# Patient Record
Sex: Female | Born: 1975 | Race: White | Hispanic: No | Smoking: Never smoker
Health system: Southern US, Community
[De-identification: ages and names within clinical notes are randomized; demographics above are authoritative.]

## PROBLEM LIST (undated history)

## (undated) DIAGNOSIS — Z8719 Personal history of other diseases of the digestive system: Secondary | ICD-10-CM

## (undated) DIAGNOSIS — L981 Factitial dermatitis: Secondary | ICD-10-CM

## (undated) DIAGNOSIS — I509 Heart failure, unspecified: Secondary | ICD-10-CM

## (undated) DIAGNOSIS — F410 Panic disorder [episodic paroxysmal anxiety] without agoraphobia: Secondary | ICD-10-CM

## (undated) DIAGNOSIS — K56609 Unspecified intestinal obstruction, unspecified as to partial versus complete obstruction: Secondary | ICD-10-CM

## (undated) DIAGNOSIS — M5481 Occipital neuralgia: Secondary | ICD-10-CM

## (undated) DIAGNOSIS — G43909 Migraine, unspecified, not intractable, without status migrainosus: Secondary | ICD-10-CM

## (undated) DIAGNOSIS — Q211 Atrial septal defect, unspecified: Secondary | ICD-10-CM

## (undated) DIAGNOSIS — F424 Excoriation (skin-picking) disorder: Secondary | ICD-10-CM

## (undated) HISTORY — DX: Heart failure, unspecified: I50.9

## (undated) HISTORY — PX: ABDOMINAL SURGERY: SHX537

## (undated) HISTORY — DX: Panic disorder (episodic paroxysmal anxiety): F41.0

## (undated) HISTORY — PX: OTHER SURGICAL HISTORY: SHX169

## (undated) HISTORY — PX: CHOLECYSTECTOMY: SHX55

---

## 2004-09-10 ENCOUNTER — Ambulatory Visit: Payer: Self-pay | Admitting: Otolaryngology

## 2004-10-02 ENCOUNTER — Emergency Department: Payer: Self-pay | Admitting: Emergency Medicine

## 2004-10-12 ENCOUNTER — Emergency Department: Payer: Self-pay | Admitting: Emergency Medicine

## 2004-11-15 ENCOUNTER — Emergency Department: Payer: Self-pay | Admitting: Emergency Medicine

## 2004-12-16 ENCOUNTER — Emergency Department: Payer: Self-pay | Admitting: Emergency Medicine

## 2005-01-12 ENCOUNTER — Encounter: Payer: Self-pay | Admitting: Physician Assistant

## 2005-01-14 ENCOUNTER — Encounter: Payer: Self-pay | Admitting: Physician Assistant

## 2005-02-13 ENCOUNTER — Encounter: Payer: Self-pay | Admitting: Physician Assistant

## 2005-03-01 ENCOUNTER — Ambulatory Visit: Payer: Self-pay | Admitting: Physician Assistant

## 2005-03-16 ENCOUNTER — Encounter: Payer: Self-pay | Admitting: Physician Assistant

## 2005-03-18 ENCOUNTER — Emergency Department: Payer: Self-pay | Admitting: Emergency Medicine

## 2005-07-21 ENCOUNTER — Ambulatory Visit: Payer: Self-pay | Admitting: Physician Assistant

## 2005-08-05 ENCOUNTER — Encounter: Payer: Self-pay | Admitting: Unknown Physician Specialty

## 2005-08-16 ENCOUNTER — Encounter: Payer: Self-pay | Admitting: Unknown Physician Specialty

## 2006-01-19 ENCOUNTER — Ambulatory Visit: Payer: Self-pay | Admitting: Pain Medicine

## 2006-01-29 ENCOUNTER — Ambulatory Visit: Payer: Self-pay | Admitting: Pain Medicine

## 2006-03-02 ENCOUNTER — Ambulatory Visit: Payer: Self-pay | Admitting: Pain Medicine

## 2006-03-31 ENCOUNTER — Ambulatory Visit: Payer: Self-pay | Admitting: Pain Medicine

## 2006-05-09 ENCOUNTER — Ambulatory Visit: Payer: Self-pay | Admitting: Physician Assistant

## 2006-06-08 ENCOUNTER — Ambulatory Visit: Payer: Self-pay | Admitting: Physician Assistant

## 2006-06-14 ENCOUNTER — Ambulatory Visit: Payer: Self-pay | Admitting: Pain Medicine

## 2006-07-18 ENCOUNTER — Ambulatory Visit: Payer: Self-pay | Admitting: Physician Assistant

## 2006-07-28 ENCOUNTER — Ambulatory Visit: Payer: Self-pay | Admitting: Pain Medicine

## 2006-07-29 ENCOUNTER — Ambulatory Visit: Payer: Self-pay | Admitting: Physician Assistant

## 2006-08-17 ENCOUNTER — Ambulatory Visit: Payer: Self-pay | Admitting: Physician Assistant

## 2006-09-07 ENCOUNTER — Ambulatory Visit: Payer: Self-pay | Admitting: Unknown Physician Specialty

## 2006-09-14 ENCOUNTER — Ambulatory Visit: Payer: Self-pay | Admitting: Unknown Physician Specialty

## 2006-09-26 ENCOUNTER — Ambulatory Visit: Payer: Self-pay | Admitting: Physician Assistant

## 2007-05-18 ENCOUNTER — Ambulatory Visit: Payer: Self-pay | Admitting: Psychiatry

## 2007-09-11 ENCOUNTER — Inpatient Hospital Stay: Payer: Self-pay | Admitting: Psychiatry

## 2007-09-11 ENCOUNTER — Other Ambulatory Visit: Payer: Self-pay

## 2008-07-13 ENCOUNTER — Emergency Department: Payer: Self-pay | Admitting: Emergency Medicine

## 2008-08-02 ENCOUNTER — Emergency Department: Payer: Self-pay | Admitting: Emergency Medicine

## 2008-08-16 ENCOUNTER — Emergency Department: Payer: Self-pay | Admitting: Unknown Physician Specialty

## 2008-08-22 ENCOUNTER — Emergency Department: Payer: Self-pay | Admitting: Emergency Medicine

## 2008-10-03 ENCOUNTER — Emergency Department: Payer: Self-pay | Admitting: Emergency Medicine

## 2008-10-22 ENCOUNTER — Ambulatory Visit: Payer: Self-pay | Admitting: Unknown Physician Specialty

## 2008-10-31 ENCOUNTER — Ambulatory Visit: Payer: Self-pay | Admitting: Unknown Physician Specialty

## 2008-11-05 ENCOUNTER — Emergency Department: Payer: Self-pay | Admitting: Emergency Medicine

## 2008-11-08 ENCOUNTER — Ambulatory Visit: Payer: Self-pay | Admitting: Unknown Physician Specialty

## 2010-06-06 ENCOUNTER — Ambulatory Visit: Payer: Self-pay | Admitting: Internal Medicine

## 2010-09-10 ENCOUNTER — Ambulatory Visit: Payer: Self-pay | Admitting: Internal Medicine

## 2011-04-08 ENCOUNTER — Ambulatory Visit: Payer: Self-pay | Admitting: Family Medicine

## 2011-11-22 ENCOUNTER — Ambulatory Visit: Payer: Self-pay | Admitting: Unknown Physician Specialty

## 2012-01-13 ENCOUNTER — Ambulatory Visit: Payer: Self-pay | Admitting: Orthopedic Surgery

## 2012-02-20 ENCOUNTER — Ambulatory Visit: Payer: Self-pay | Admitting: Medical

## 2012-09-28 ENCOUNTER — Emergency Department: Payer: Self-pay | Admitting: Emergency Medicine

## 2012-09-28 LAB — CBC
HCT: 43.6 % (ref 35.0–47.0)
HGB: 14.8 g/dL (ref 12.0–16.0)
MCH: 29.3 pg (ref 26.0–34.0)
MCV: 86 fL (ref 80–100)
RDW: 13.1 % (ref 11.5–14.5)
WBC: 11.6 10*3/uL — ABNORMAL HIGH (ref 3.6–11.0)

## 2012-09-28 LAB — COMPREHENSIVE METABOLIC PANEL
Bilirubin,Total: 0.9 mg/dL (ref 0.2–1.0)
Calcium, Total: 8.3 mg/dL — ABNORMAL LOW (ref 8.5–10.1)
Co2: 20 mmol/L — ABNORMAL LOW (ref 21–32)
Creatinine: 0.57 mg/dL — ABNORMAL LOW (ref 0.60–1.30)
EGFR (Non-African Amer.): >60
Osmolality: 275 (ref 275–301)
Potassium: 3.7 mmol/L (ref 3.5–5.1)
SGOT(AST): 22 U/L (ref 15–37)
SGPT (ALT): 27 U/L (ref 12–78)
Total Protein: 7.5 g/dL (ref 6.4–8.2)

## 2012-09-28 LAB — URINALYSIS, COMPLETE
Bilirubin,UR: NEGATIVE
Blood: NEGATIVE
Glucose,UR: NEGATIVE mg/dL (ref 0–75)
Ketone: NEGATIVE
Leukocyte Esterase: NEGATIVE
Ph: 6 (ref 4.5–8.0)
Protein: 25
Squamous Epithelial: 3
WBC UR: 1 /HPF (ref 0–5)

## 2012-09-28 LAB — LIPASE, BLOOD: Lipase: 89 U/L (ref 73–393)

## 2013-01-29 ENCOUNTER — Ambulatory Visit: Payer: Self-pay | Admitting: Emergency Medicine

## 2013-01-29 LAB — URINALYSIS, COMPLETE
Glucose,UR: NEGATIVE mg/dL (ref 0–75)
Nitrite: NEGATIVE

## 2013-01-29 LAB — PREGNANCY, URINE: Pregnancy Test, Urine: NEGATIVE m[IU]/mL

## 2013-05-09 ENCOUNTER — Other Ambulatory Visit: Payer: Self-pay | Admitting: Sports Medicine

## 2013-05-09 DIAGNOSIS — M545 Low back pain: Secondary | ICD-10-CM

## 2014-10-12 ENCOUNTER — Emergency Department: Payer: Self-pay | Admitting: Emergency Medicine

## 2014-11-04 ENCOUNTER — Ambulatory Visit: Payer: Self-pay | Admitting: Endocrinology

## 2015-03-27 ENCOUNTER — Inpatient Hospital Stay
Admission: EM | Admit: 2015-03-27 | Discharge: 2015-04-04 | DRG: 438 | Disposition: A | Discharge status: Home / Self Care | Payer: 59 | Attending: Internal Medicine | Admitting: Internal Medicine

## 2015-03-27 ENCOUNTER — Encounter: Payer: Self-pay | Admitting: Urgent Care

## 2015-03-27 DIAGNOSIS — R000 Tachycardia, unspecified: Secondary | ICD-10-CM | POA: Diagnosis present

## 2015-03-27 DIAGNOSIS — I10 Essential (primary) hypertension: Secondary | ICD-10-CM | POA: Diagnosis present

## 2015-03-27 DIAGNOSIS — R1084 Generalized abdominal pain: Secondary | ICD-10-CM

## 2015-03-27 DIAGNOSIS — K859 Acute pancreatitis without necrosis or infection, unspecified: Secondary | ICD-10-CM

## 2015-03-27 DIAGNOSIS — Z791 Long term (current) use of non-steroidal anti-inflammatories (NSAID): Secondary | ICD-10-CM

## 2015-03-27 DIAGNOSIS — R0602 Shortness of breath: Secondary | ICD-10-CM

## 2015-03-27 DIAGNOSIS — K851 Biliary acute pancreatitis: Secondary | ICD-10-CM | POA: Diagnosis not present

## 2015-03-27 DIAGNOSIS — R0989 Other specified symptoms and signs involving the circulatory and respiratory systems: Secondary | ICD-10-CM

## 2015-03-27 DIAGNOSIS — Z87738 Personal history of other specified (corrected) congenital malformations of digestive system: Secondary | ICD-10-CM

## 2015-03-27 DIAGNOSIS — R112 Nausea with vomiting, unspecified: Secondary | ICD-10-CM

## 2015-03-27 DIAGNOSIS — E876 Hypokalemia: Secondary | ICD-10-CM | POA: Diagnosis present

## 2015-03-27 DIAGNOSIS — R109 Unspecified abdominal pain: Secondary | ICD-10-CM

## 2015-03-27 DIAGNOSIS — I509 Heart failure, unspecified: Secondary | ICD-10-CM | POA: Diagnosis present

## 2015-03-27 DIAGNOSIS — E8770 Fluid overload, unspecified: Secondary | ICD-10-CM | POA: Diagnosis present

## 2015-03-27 DIAGNOSIS — J9600 Acute respiratory failure, unspecified whether with hypoxia or hypercapnia: Secondary | ICD-10-CM | POA: Diagnosis present

## 2015-03-27 DIAGNOSIS — J189 Pneumonia, unspecified organism: Secondary | ICD-10-CM | POA: Diagnosis present

## 2015-03-27 DIAGNOSIS — Z9049 Acquired absence of other specified parts of digestive tract: Secondary | ICD-10-CM | POA: Diagnosis present

## 2015-03-27 HISTORY — DX: Personal history of other diseases of the digestive system: Z87.19

## 2015-03-27 HISTORY — DX: Atrial septal defect, unspecified: Q21.10

## 2015-03-27 HISTORY — DX: Atrial septal defect: Q21.1

## 2015-03-27 MED ORDER — ONDANSETRON HCL 4 MG/2ML IJ SOLN
INTRAMUSCULAR | Status: AC
  Administered 2015-03-27: 4 mg via INTRAVENOUS
  Filled 2015-03-27: qty 2

## 2015-03-27 MED ORDER — MORPHINE SULFATE 4 MG/ML IJ SOLN
INTRAMUSCULAR | Status: AC
  Administered 2015-03-27: 4 mg via INTRAVENOUS
  Filled 2015-03-27: qty 1

## 2015-03-27 NOTE — ED Notes (Signed)
Patient presents to ED with c/o epigastric pain that is through and through to her back that started today at noon. (+) N/V. Patient tearful and vomiting upon arrival.

## 2015-03-28 ENCOUNTER — Emergency Department: Payer: 59

## 2015-03-28 ENCOUNTER — Encounter: Payer: Self-pay | Admitting: Radiology

## 2015-03-28 DIAGNOSIS — E8770 Fluid overload, unspecified: Secondary | ICD-10-CM | POA: Diagnosis present

## 2015-03-28 DIAGNOSIS — Z9049 Acquired absence of other specified parts of digestive tract: Secondary | ICD-10-CM | POA: Diagnosis present

## 2015-03-28 DIAGNOSIS — Z87738 Personal history of other specified (corrected) congenital malformations of digestive system: Secondary | ICD-10-CM | POA: Diagnosis not present

## 2015-03-28 DIAGNOSIS — R000 Tachycardia, unspecified: Secondary | ICD-10-CM | POA: Diagnosis present

## 2015-03-28 DIAGNOSIS — E876 Hypokalemia: Secondary | ICD-10-CM | POA: Diagnosis present

## 2015-03-28 DIAGNOSIS — J9600 Acute respiratory failure, unspecified whether with hypoxia or hypercapnia: Secondary | ICD-10-CM | POA: Diagnosis present

## 2015-03-28 DIAGNOSIS — Z791 Long term (current) use of non-steroidal anti-inflammatories (NSAID): Secondary | ICD-10-CM | POA: Diagnosis not present

## 2015-03-28 DIAGNOSIS — R1084 Generalized abdominal pain: Secondary | ICD-10-CM | POA: Diagnosis present

## 2015-03-28 DIAGNOSIS — I509 Heart failure, unspecified: Secondary | ICD-10-CM | POA: Diagnosis present

## 2015-03-28 DIAGNOSIS — K851 Biliary acute pancreatitis: Secondary | ICD-10-CM | POA: Diagnosis present

## 2015-03-28 DIAGNOSIS — J189 Pneumonia, unspecified organism: Secondary | ICD-10-CM | POA: Diagnosis present

## 2015-03-28 DIAGNOSIS — K859 Acute pancreatitis without necrosis or infection, unspecified: Secondary | ICD-10-CM | POA: Diagnosis present

## 2015-03-28 DIAGNOSIS — I10 Essential (primary) hypertension: Secondary | ICD-10-CM | POA: Diagnosis present

## 2015-03-28 LAB — URINALYSIS COMPLETE WITH MICROSCOPIC (ARMC ONLY)
BILIRUBIN URINE: NEGATIVE
Bacteria, UA: NONE SEEN
Glucose, UA: NEGATIVE mg/dL
Hgb urine dipstick: NEGATIVE
Ketones, ur: NEGATIVE mg/dL
LEUKOCYTES UA: NEGATIVE
Nitrite: NEGATIVE
PROTEIN: NEGATIVE mg/dL
Specific Gravity, Urine: 1.032 — ABNORMAL HIGH (ref 1.005–1.030)
pH: 6 (ref 5.0–8.0)

## 2015-03-28 LAB — COMPREHENSIVE METABOLIC PANEL
ALBUMIN: 3.2 g/dL — AB (ref 3.5–5.0)
ALBUMIN: 3.7 g/dL (ref 3.5–5.0)
ALBUMIN: 4 g/dL (ref 3.5–5.0)
ALK PHOS: 64 U/L (ref 38–126)
ALT: 17 U/L (ref 14–54)
ALT: 21 U/L (ref 14–54)
ALT: 22 U/L (ref 14–54)
AST: 23 U/L (ref 15–41)
AST: 24 U/L (ref 15–41)
AST: 34 U/L (ref 15–41)
Alkaline Phosphatase: 75 U/L (ref 38–126)
Alkaline Phosphatase: 75 U/L (ref 38–126)
Anion gap: 11 (ref 5–15)
Anion gap: 8 (ref 5–15)
Anion gap: 8 (ref 5–15)
BILIRUBIN TOTAL: 0.7 mg/dL (ref 0.3–1.2)
BUN: <5 mg/dL — ABNORMAL LOW (ref 6–20)
BUN: <5 mg/dL — ABNORMAL LOW (ref 6–20)
CALCIUM: 10.5 mg/dL — AB (ref 8.9–10.3)
CALCIUM: 8.5 mg/dL — AB (ref 8.9–10.3)
CALCIUM: 9.4 mg/dL (ref 8.9–10.3)
CO2: 27 mmol/L (ref 22–32)
CO2: 27 mmol/L (ref 22–32)
CO2: 29 mmol/L (ref 22–32)
CREATININE: 0.47 mg/dL (ref 0.44–1.00)
Chloride: 99 mmol/L — ABNORMAL LOW (ref 101–111)
Chloride: 101 mmol/L (ref 101–111)
Chloride: 101 mmol/L (ref 101–111)
Creatinine, Ser: 0.63 mg/dL (ref 0.44–1.00)
Creatinine, Ser: 0.51 mg/dL (ref 0.44–1.00)
GFR calc Af Amer: >60 mL/min (ref >60)
GFR calc Af Amer: >60 mL/min (ref >60)
GFR calc non Af Amer: >60 mL/min (ref >60)
GFR calc non Af Amer: >60 mL/min (ref >60)
GLUCOSE: 143 mg/dL — AB (ref 65–99)
Glucose, Bld: 175 mg/dL — ABNORMAL HIGH (ref 65–99)
Glucose, Bld: 150 mg/dL — ABNORMAL HIGH (ref 65–99)
POTASSIUM: 3.9 mmol/L (ref 3.5–5.1)
Potassium: 4 mmol/L (ref 3.5–5.1)
Potassium: 4.2 mmol/L (ref 3.5–5.1)
SODIUM: 137 mmol/L (ref 135–145)
SODIUM: 138 mmol/L (ref 135–145)
Sodium: 136 mmol/L (ref 135–145)
TOTAL PROTEIN: 6 g/dL — AB (ref 6.5–8.1)
Total Bilirubin: 0.8 mg/dL (ref 0.3–1.2)
Total Bilirubin: 0.6 mg/dL (ref 0.3–1.2)
Total Protein: 6.8 g/dL (ref 6.5–8.1)
Total Protein: 6.7 g/dL (ref 6.5–8.1)

## 2015-03-28 LAB — LIPID PANEL
CHOL/HDL RATIO: 5.3 ratio
Cholesterol: 164 mg/dL (ref 0–200)
HDL: 31 mg/dL — ABNORMAL LOW (ref >40)
LDL CALC: 82 mg/dL (ref 0–99)
Triglycerides: 257 mg/dL — ABNORMAL HIGH (ref <150)
VLDL: 51 mg/dL — ABNORMAL HIGH (ref 0–40)

## 2015-03-28 LAB — CBC
HEMATOCRIT: 44.1 % (ref 35.0–47.0)
Hemoglobin: 15 g/dL (ref 12.0–16.0)
MCH: 29.1 pg (ref 26.0–34.0)
MCHC: 33.9 g/dL (ref 32.0–36.0)
MCV: 85.9 fL (ref 80.0–100.0)
PLATELETS: 382 10*3/uL (ref 150–440)
RBC: 5.13 MIL/uL (ref 3.80–5.20)
RDW: 13.1 % (ref 11.5–14.5)
WBC: 18.6 10*3/uL — AB (ref 3.6–11.0)

## 2015-03-28 LAB — TROPONIN I

## 2015-03-28 LAB — LIPASE, BLOOD: LIPASE: 1021 U/L — AB (ref 22–51)

## 2015-03-28 MED ORDER — HYDROMORPHONE HCL 1 MG/ML IJ SOLN
1.0000 mg | Freq: Once | INTRAMUSCULAR | Status: AC
  Administered 2015-03-28: 1 mg via INTRAVENOUS
  Filled 2015-03-28: qty 1

## 2015-03-28 MED ORDER — ONDANSETRON HCL 4 MG/2ML IJ SOLN
4.0000 mg | Freq: Once | INTRAMUSCULAR | Status: AC
  Administered 2015-03-27: 4 mg via INTRAVENOUS

## 2015-03-28 MED ORDER — SODIUM CHLORIDE 0.9 % IV BOLUS (SEPSIS)
1000.0000 mL | Freq: Once | INTRAVENOUS | Status: AC
  Administered 2015-03-28: 1000 mL via INTRAVENOUS

## 2015-03-28 MED ORDER — IOHEXOL 350 MG/ML SOLN
100.0000 mL | Freq: Once | INTRAVENOUS | Status: AC | PRN
  Administered 2015-03-28: 100 mL via INTRAVENOUS

## 2015-03-28 MED ORDER — HYDROMORPHONE HCL 1 MG/ML IJ SOLN
1.0000 mg | INTRAMUSCULAR | Status: DC | PRN
  Administered 2015-03-28 – 2015-03-31 (×15): 1 mg via INTRAVENOUS
  Filled 2015-03-28 (×15): qty 1

## 2015-03-28 MED ORDER — SODIUM CHLORIDE 0.9 % IV SOLN
INTRAVENOUS | Status: DC
  Administered 2015-03-28 – 2015-03-29 (×8): via INTRAVENOUS

## 2015-03-28 MED ORDER — MORPHINE SULFATE 4 MG/ML IJ SOLN
4.0000 mg | Freq: Once | INTRAMUSCULAR | Status: DC
  Filled 2015-03-28: qty 1

## 2015-03-28 MED ORDER — HEPARIN SODIUM (PORCINE) 5000 UNIT/ML IJ SOLN
5000.0000 [IU] | Freq: Three times a day (TID) | INTRAMUSCULAR | Status: DC
  Administered 2015-03-28 – 2015-04-04 (×22): 5000 [IU] via SUBCUTANEOUS
  Filled 2015-03-28 (×22): qty 1

## 2015-03-28 MED ORDER — ONDANSETRON HCL 4 MG/2ML IJ SOLN
INTRAMUSCULAR | Status: AC
  Administered 2015-03-28: 4 mg via INTRAVENOUS
  Filled 2015-03-28: qty 2

## 2015-03-28 MED ORDER — ONDANSETRON HCL 4 MG PO TABS: 4.0000 mg | ORAL_TABLET | Freq: Four times a day (QID) | ORAL | Status: DC | PRN

## 2015-03-28 MED ORDER — OXYCODONE HCL 5 MG PO TABS
5.0000 mg | ORAL_TABLET | ORAL | Status: DC | PRN
  Administered 2015-03-28 – 2015-04-04 (×23): 5 mg via ORAL
  Filled 2015-03-28 (×24): qty 1

## 2015-03-28 MED ORDER — ACETAMINOPHEN 325 MG PO TABS
650.0000 mg | ORAL_TABLET | Freq: Four times a day (QID) | ORAL | Status: DC | PRN
  Administered 2015-03-28: 650 mg via ORAL
  Filled 2015-03-28: qty 2

## 2015-03-28 MED ORDER — HYDROMORPHONE HCL 1 MG/ML IJ SOLN
1.0000 mg | INTRAMUSCULAR | Status: DC | PRN
  Administered 2015-03-28 (×2): 1 mg via INTRAVENOUS
  Filled 2015-03-28 (×2): qty 1

## 2015-03-28 MED ORDER — MORPHINE SULFATE 4 MG/ML IJ SOLN
4.0000 mg | Freq: Once | INTRAMUSCULAR | Status: AC
  Administered 2015-03-27: 4 mg via INTRAVENOUS

## 2015-03-28 MED ORDER — ONDANSETRON HCL 4 MG/2ML IJ SOLN
4.0000 mg | Freq: Once | INTRAMUSCULAR | Status: AC
  Administered 2015-03-28: 4 mg via INTRAVENOUS

## 2015-03-28 MED ORDER — ONDANSETRON HCL 4 MG/2ML IJ SOLN
4.0000 mg | Freq: Four times a day (QID) | INTRAMUSCULAR | Status: DC | PRN
  Administered 2015-03-28 (×3): 4 mg via INTRAVENOUS
  Filled 2015-03-28 (×3): qty 2

## 2015-03-28 MED ORDER — ACETAMINOPHEN 650 MG RE SUPP: 650.0000 mg | Freq: Four times a day (QID) | RECTAL | Status: DC | PRN

## 2015-03-28 NOTE — Progress Notes (Signed)
Patient was admitted from ED d/t to c/o of abdominal pain. Patient was able to ambulate from the ER stretcher to her room. Patient arrived with 2L of supplemental oxygen, she was A&O X4 and hemodynamically stable. Patient admission documentation was completed and was  oriented to the unit and her room. PRN pain med was administered for pain of 7/10. Patient later stated a mild relieve from pain. Patient denied nausea at this time. Patient is kept NPO overnight .Will continue to monitor.

## 2015-03-28 NOTE — Consult Note (Signed)
Consultation  Referring Provider: Dr. Delfino Lovett Primary Care Physician:  No PCP Per Patient Consulting  Gastroenterologist:    Dr. Lynnae Prude     Reason for Consultation: Acute Pancreatitis           HPI:   Helen Burgess is a 39 y.o. 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 This pain started yesterday at 1300 without apparent cause. She had  3 episodes of non- bloody, non bilious vomiting.  She had seen black stool for about 3-4 days prior-no diarrhea or foul odor. No history of ulcers or ETOH use. No gallstone problems and she reports an uncomplicated cholecystectomy many years ago and did not have bile duct problems.   She has chronic neck, head,and shoulder pain and takes 4 Ibuprofen 3- 4 times daily for about 1 year. She denies GI upset before yesterday. She also placed herself on oral potasium OTC and magnesium recently. No antibiotics. She never has heartburn and has not taken any PPI or H2 blockers. She reports a remote EGD and does not recall the results. No hx of PUD.   The abdominal pain has been constant and IV dilaudid eases-but does not resolve. She is NPO. Anorexic.  Labs and CT suggest pancreatitis. Lipase 1021. WBC 18. Hgb 15. BUN<5 Cr 0. 47.  Past Medical History  Diagnosis Date  . ASD (atrial septal defect)   . Hx SBO     Past Surgical History  Procedure Laterality Date  . Cholecystectomy    . 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 Diabetes Neg Hx      Social History  Substance Use Topics  . Smoking status: Never Smoker   . Smokeless tobacco: None  . Alcohol  Use: No    Prior to Admission medications   Not on File    Current Facility-Administered Medications  Medication Dose Route Frequency Provider Last Rate Last Dose  . 0.9 %  sodium chloride infusion   Intravenous Continuous Wyatt Haste, MD 250 mL/hr at 03/28/15 0935    . acetaminophen (TYLENOL) tablet 650 mg  650 mg Oral Q6H PRN Wyatt Haste, MD       Or  . acetaminophen (TYLENOL) suppository 650 mg  650 mg Rectal Q6H PRN Wyatt Haste, MD      . heparin injection 5,000 Units  5,000 Units Subcutaneous 3 times per day Wyatt Haste, MD   5,000 Units at 03/28/15 1235  . HYDROmorphone (DILAUDID) injection 1 mg  1 mg Intravenous Q2H PRN Delfino Lovett, MD   1 mg at 03/28/15 1443  . morphine 4 MG/ML injection 4 mg  4 mg Intravenous Once Arnaldo Natal, MD      . ondansetron [facility]) tablet 4 mg  4 mg Oral Q6H PRN Wyatt Haste, MD       Or  . ondansetron [facility]) injection 4 mg  4 mg Intravenous Q6H PRN Wyatt Haste, MD   4 mg at 03/28/15 1235  . oxyCODONE (Oxy IR/ROXICODONE) immediate release tablet 5 mg  5 mg Oral  Q4H PRN Wyatt Haste, MD        Allergies as of 03/27/2015 - Review Complete 03/27/2015  Allergen Reaction Noted  . Ultram [tramadol] Itching 03/27/2015     Review of Systems:    A 12 system review was obtained and all negative except where noted in HPI.    Physical Exam:  Vital signs in last 24 hours: Temp:  [97.9 F (36.6 C)-98.9 F (37.2 C)] 98.9 F (37.2 C) (08/12 1520) Pulse Rate:  [97-108] 106 (08/12 1520) Resp:  [17-30] 18 (08/12 1520) BP: (133-157)/(86-110) 133/87 mmHg (08/12 1520) SpO2:  [90 %-100 %] 90 % (08/12 1520) Weight:  [77.111 kg (170 lb)] 77.111 kg (170 lb) (08/11 2351) Last BM Date: 03/27/15  General:  Well-developed, well-nourished and in moderate pain- receiving pain meds Head:  Head without obvious abnormality, atraumatic  Eyes:   Conjunctiva pink, sclera anicteric   ENT:   Mouth free of lesions, mucosa moist,  tongue pink, no thrush  noted, teeth and gums normal Neck:   Supple w/o thyromegaly or mass, trachea midline, no adenopathy  Lungs: Clear to auscultation bilaterally, respirations unlabored Heart:     Normal S1S2, no rubs, murmurs, gallops. Abdomen: Soft, tender epigastric area, mildly bloated, not tense,  no hepatosplenomegaly, hernia, or mass and BS normal Rectal: Deferred Lymph:  No cervical or supraclavicular adenopathy. Extremities:   No edema, cyanosis, or clubbing Skin  Skin color, texture, turgor normal, positive acne lesions on face Neuro:  A&O x 3. CNII-XII intact, normal strength Psych:  Anxious, in pain Data Reviewed:  LAB RESULTS:  Recent Labs  03/28/15 0005  WBC 18.6*  HGB 15.0  HCT 44.1  PLT 382   BMET  Recent Labs  03/28/15 0402 03/28/15 1247  NA 138 136  K 4.0 4.2  CL 101 101  CO2 29 27  GLUCOSE 150* 143*  BUN <5* <5*  CREATININE 0.51 0.47  CALCIUM 9.4 8.5*   LFT  Recent Labs  03/28/15 1247  PROT 6.0*  ALBUMIN 3.2*  AST 23  ALT 17  ALKPHOS 64  BILITOT 0.7   PT/INR No results for input(s): LABPROT, INR in the last 72 hours.  STUDIES: Ct Abdomen Pelvis W Contrast  03/28/2015   CLINICAL DATA:  Acute onset of epigastric abdominal pain, radiating to the back. Nausea and vomiting. Initial encounter.  EXAM: CT ABDOMEN AND PELVIS WITH CONTRAST  TECHNIQUE: Multidetector CT imaging of the abdomen and pelvis was performed using the standard protocol following bolus administration of intravenous contrast.  CONTRAST:  OMNIPAQUE IOHEXOL 350 MG/ML SOLN  COMPARISON:  CT of the abdomen and pelvis from 09/28/2012  FINDINGS: Mild bibasilar atelectasis is noted.  There appears to be a small bowel interposition replacement of the esophagus, retrosternal in nature. The anastomosis with the stomach is unremarkable in appearance.  Diffuse soft tissue edema and inflammation are noted about the entirety of the pancreas, most prominent about the pancreatic head and body, with soft tissue  edema and fluid tracking inferiorly anterior to the right kidney. There is no evidence of devascularization or pseudocyst formation at this time.  The liver and spleen are unremarkable in appearance. The patient is status post cholecystectomy, with clips noted at the gallbladder fossa. The pancreas and adrenal glands are unremarkable.  The kidneys are unremarkable in appearance. There is no evidence of hydronephrosis. No renal or ureteral stones are seen. No perinephric stranding is appreciated.  No free fluid is identified. The small bowel is unremarkable in  appearance. The stomach is within normal limits. No acute vascular abnormalities are seen.  The appendix is normal in caliber, without evidence of appendicitis. The colon is unremarkable in appearance.  The bladder is mildly distended and grossly unremarkable. The uterus is unremarkable in appearance. The ovaries are grossly symmetric. No suspicious adnexal masses are seen. No inguinal lymphadenopathy is seen.  No acute osseous abnormalities are identified.  IMPRESSION: 1. Diffuse soft tissue edema and inflammation about the entirety of the pancreas, most prominent about the pancreatic head and body, with soft tissue edema and fluid tracking inferiorly anterior to the right kidney, compatible with acute pancreatitis. No evidence of devascularization or pseudocyst formation at this time. 2. Small bowel interposition replacement of the esophagus, retrosternal in nature. This is grossly unremarkable in appearance. No evidence for obstruction.   Electronically Signed   By: Roanna Raider M.D.   On: 03/28/2015 01:23     Assessment:  Staci W Noy is a 39 y.o. presents with acute upper sharp pain with radiation into back and down the abdomen a/w brief n/v and self reports melena  3- 4 days prior.  She has Ct documented pancreatitis without pseudocyst. She has elevated lipase, mild leukocytosis, normal renal function.    Etiology to rule/out mechanical cause  for pancreatitis from duodenal  stricture or obstruction,  gallstone, malignancy. Heavy chronic NSAID use at risk for PUD. However, no free air on CT. Penetrating  Peptic Ulcer is considered in differential. No ETOH past or present per patient. No family present to confirm. Lipids not too high. No new medications. Remote cholecystectomy. No hx of recent infection, trauma, vascular problems.   Plan:  Close lab monitoring. Hydration. I and O measurement. Hemoccult cards. Abd [place] to check for gallstones and bile duct. Consider Igg4, ANA . Consider eventual EGD. Further GI recommendations pending.   This case was discussed with Dr. Scot Jun in collaboration of care. Thank you for the consultation.  These services provided by Amedeo Kinsman RN, MSN, ANP-BC under collaborative practice agreement with Scot Jun, MD.   03/28/2015, 3:27 PM

## 2015-03-28 NOTE — Consult Note (Signed)
See note by Fransico Setters.  Pt with acute pancreatitis of unknown origin.  She has had previous gall bladder removal so CBD stone is a possibility.  Pancreas divisum also a possibility.  She has been on high dose NSAID and ulcer with contained perforation is a possibility.  She appears to have rather poor veins in her arms and may need a PIC line placed soon.  Recommend MRCP for tomorrow.

## 2015-03-28 NOTE — ED Provider Notes (Signed)
[facility] Emergency Department Provider Note  ____________________________________________  Time seen: Approximately 12:10 AM  I have reviewed the triage vital signs and the nursing notes.   HISTORY  Chief Complaint Abdominal Pain    HPI Helen Burgess is a 39 y.o. female who presents to the ED from home with complaints of diffuse abdominal pain and vomiting. Patient has a complicated surgical history including esophageal repair as an infant, open cholecystectomy, and history of multiple small bowel obstructionsrequiring surgical repair. Patient reports she was in her usual state of health until approximately 1 PM when she began to experience upper abdominal pain which subsequently became diffuse. Describes 10/10 sharp, constant pain which radiates to her back. Associated with 3 episodes of nausea and vomiting. Patient reports small bowel movement this morning prior to onset of pain. Nothing makes the pain better or worse. Patient states this feels similar to her prior SBOs.   Past Medical History  Diagnosis Date  . ASD (atrial septal defect)   . Hx SBO     There are no active problems to display for this patient.   Past Surgical History  Procedure Laterality Date  . Cholecystectomy    . 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  Allergies Ultram  No family history on file.  Social History Social History  Substance Use Topics  . Smoking status: Never Smoker   . Smokeless tobacco: None  . Alcohol Use: No    Review of Systems Constitutional: No fever/chills Eyes: No visual changes. ENT: No sore throat. Cardiovascular: Denies chest pain. Respiratory: Denies shortness of breath. Gastrointestinal: Nausea for abdominal pain.  Positive for nausea and vomiting.  No diarrhea.  No constipation. Genitourinary: Negative for dysuria. Musculoskeletal:  Negative for back pain. Skin: Negative for rash. Neurological: Negative for headaches, focal weakness or numbness.  10-point ROS otherwise negative.  ____________________________________________   PHYSICAL EXAM:  VITAL SIGNS: ED Triage Vitals  Enc Vitals Group     BP 03/27/15 2351 157/108 mmHg     Pulse Rate 03/27/15 2351 102     Resp 03/27/15 2351 30     Temp 03/27/15 2351 97.9 F (36.6 C)     Temp Source 03/27/15 2351 Oral     SpO2 03/27/15 2351 96 %     Weight 03/27/15 2351 170 lb (77.111 kg)     Height 03/27/15 2351 5\' 7"  (1.702 m)     Head Cir --      Peak Flow --      Pain Score 03/27/15 2352 10     Pain Loc --      Pain Edu? --      Excl. in GC? --     Constitutional: Alert and oriented. Ill-appearing and in moderate acute distress. Writhing in pain, cannot lay still. Eyes: Conjunctivae are normal. PERRL. EOMI. Head: Atraumatic. Nose: No congestion/rhinnorhea. Mouth/Throat: Mucous membranes are moist.  Oropharynx non-erythematous. Neck: No stridor.   Cardiovascular: Normal rate, regular rhythm. Grossly normal heart sounds.  Good peripheral circulation. Respiratory: Normal respiratory effort.  No retractions. Lungs CTAB. Gastrointestinal: Mildly distended, tender to palpation diffusely with guarding. No abdominal bruits. No CVA tenderness. Musculoskeletal: No lower extremity tenderness nor edema.  No joint effusions. Neurologic:  Normal speech and language. No gross focal neurologic deficits are appreciated. No gait instability. Skin:  Skin is warm, dry and intact. No rash  noted. Psychiatric: Mood and affect are normal. Speech and behavior are normal.  ____________________________________________   LABS (all labs ordered are listed, but only abnormal results are displayed)  Labs Reviewed  LIPASE, BLOOD - Abnormal; Notable for the following:    Lipase 1021 (*)    All other components within normal limits  COMPREHENSIVE METABOLIC PANEL - Abnormal; Notable for  the following:    Chloride 99 (*)    Glucose, Bld 175 (*)    BUN <5 (*)    Calcium 10.5 (*)    All other components within normal limits  CBC - Abnormal; Notable for the following:    WBC 18.6 (*)    All other components within normal limits  TROPONIN I  URINALYSIS COMPLETEWITH MICROSCOPIC ([facility] ONLY)   ____________________________________________  EKG  ED ECG REPORT I, Kaijah Abts J, the attending physician, personally viewed and interpreted this ECG.   Date: 03/28/2015  EKG Time: 2356  Rate: 97  Rhythm: normal EKG, normal sinus rhythm  Axis: Normal  Intervals:none  ST&T Change: Inverted T waves V2-V5  ____________________________________________  RADIOLOGY  CT abdomen and pelvis with contrast (viewed by me, interpreted per Dr. Cherly Hensen): 1. Diffuse soft tissue edema and inflammation about the entirety of the pancreas, most prominent about the pancreatic head and body, with soft tissue edema and fluid tracking inferiorly anterior to the right kidney, compatible with acute pancreatitis. No evidence of devascularization or pseudocyst formation at this time. 2. Small bowel interposition replacement of the esophagus, retrosternal in nature. This is grossly unremarkable in appearance. No evidence for obstruction.  ____________________________________________   PROCEDURES  Procedure(s) performed: None  Critical Care performed: Yes, see critical care note(s)  CRITICAL CARE Performed by: Irean Hong   Total critical care time: 30 minutes  Critical care time was exclusive of separately billable procedures and treating other patients.  Critical care was necessary to treat or prevent imminent or life-threatening deterioration.  Critical care was time spent personally by me on the following activities: development of treatment plan with patient and/or surrogate as well as nursing, discussions with consultants, evaluation of patient's response to treatment, examination of  patient, obtaining history from patient or surrogate, ordering and performing treatments and interventions, ordering and review of laboratory studies, ordering and review of radiographic studies, pulse oximetry and re-evaluation of patient's condition.  ____________________________________________   INITIAL IMPRESSION / ASSESSMENT AND PLAN / ED COURSE  Pertinent labs & imaging results that were available during my care of the patient were reviewed by me and considered in my medical decision making (see chart for details).  39 year old female with a history of multiple abdominal surgeries presenting with abdominal pain and vomiting; symptoms concerning for small bowel obstruction. IV morphine did nothing for patient's pain; she continues to writhe in the bed. Will administer IV dilaudid and proceed directly for noncontrast CT scan of her abdomen/pelvis.  ----------------------------------------- 1:36 AM on 03/28/2015 -----------------------------------------  Patient is resting more comfortably. Glycerin swabs given for dry mouth. Updated patient of lab and CT results. Discussed with hospitalist for evaluation in the emergency department for hospital admission.  ____________________________________________   FINAL CLINICAL IMPRESSION(S) / ED DIAGNOSES  Final diagnoses:  Generalized abdominal pain  Non-intractable vomiting with nausea, vomiting of unspecified type  Acute pancreatitis, unspecified pancreatitis type      Irean Hong, MD 03/28/15 [phone]

## 2015-03-28 NOTE — Progress Notes (Addendum)
Initial Nutrition Assessment   INTERVENTION:   Coordination of care: await diet advancement as able   NUTRITION DIAGNOSIS:   Inadequate oral intake related to inability to eat as evidenced by NPO status.  GOAL:   Patient will meet greater than or equal to 90% of their needs  MONITOR:    (Energy Intake, Gastrointestinal Profile, Anthropometrics)  REASON FOR ASSESSMENT:   Malnutrition Screening Tool, Diagnosis    ASSESSMENT:   Pt admitted with acute pancreatitis.  Pt very sleepy/groggy on visit this afternoon.  Past Medical History  Diagnosis Date  . ASD (atrial septal defect)   . Hx SBO     Diet Order:  Diet NPO time specified Except for: Sips with Meds    Current Nutrition: Pt currently NPO   Food/Nutrition-Related History: Pt report eating less the past 4-6 weeks PTA, eating breakfast in the am and then a little something before going to bed daily.    Medications: NS at 260mL/hr, morphine, dilaudid  Electrolyte/Renal Profile and Glucose Profile:   Recent Labs Lab 03/28/15 0005 03/28/15 0402 03/28/15 1247  NA 137 138 136  K 3.9 4.0 4.2  CL 99* 101 101  CO2 BUN <5* <5* <5*  CREATININE 0.63 0.51 0.47  CALCIUM 10.5* 9.4 8.5*  GLUCOSE 175* 150* 143*   Protein Profile:   Recent Labs Lab 03/28/15 0005 03/28/15 0402 03/28/15 1247  ALBUMIN 4.0 3.7 3.2*    Lipase     Component Value Date/Time   LIPASE 1021* 03/28/2015 0005    Lipid Panel     Component Value Date/Time   CHOL 164 03/28/2015 0402   TRIG 257* 03/28/2015 0402   HDL 31* 03/28/2015 0402   CHOLHDL 5.3 03/28/2015 0402   VLDL 51* 03/28/2015 0402   LDLCALC 82 03/28/2015 0402    Gastrointestinal Profile: Last BM:  03/27/2015   Nutrition-Focused Physical Exam Findings: Unable to complete Nutrition-Focused physical exam at this time.    Weight Change: Pt reports weight loss of 20lbs, but has recently gained 10lbs back. Pt reports weight of 170lbs 2 days PTA.     Height:   Ht Readings from Last 1 Encounters:  03/27/15  (1.702 m)    Weight:   Wt Readings from Last 1 Encounters:  03/27/15 170 lb (77.111 kg)    BMI:  Body mass index is 26.62 kg/(m^2).  Estimated Nutritional Needs:   Kcal:  2128-2514kcals, BEE: 1488kcals, TEE: (IF 1.1-1.3)(AF 1.3)   Protein:  61-77g protein (0.8-1.0g/kg)   Fluid:  1925-2386mL of fluid (25-44mL/kg)  EDUCATION NEEDS:   Education needs no appropriate at this time    MODERATE Care Level  Leda Quail, RD, LDN Pager [phone]

## 2015-03-28 NOTE — ED Notes (Signed)
Patient returned to room from CT. 

## 2015-03-28 NOTE — ED Notes (Signed)
Patient desaturating on RA secondary to hypoventilation because of pain and because of narcotic administration. Patient placed on supplemental oxygen at 2L/[place]. MD made aware.

## 2015-03-28 NOTE — H&P (Signed)
[facility] - [place] at [facility]   PATIENT NAME: Helen Burgess    MR#:  [id number]  DATE OF BIRTH:  [birth year]   DATE OF ADMISSION:  03/27/2015  PRIMARY CARE PHYSICIAN: No PCP Per Patient   REQUESTING/REFERRING PHYSICIAN: Dolores Frame  CHIEF COMPLAINT:   Chief Complaint  Patient presents with  . Abdominal Pain    HISTORY OF PRESENT ILLNESS:  Helen Burgess  is a 39 y.o. female with a known history of esophageal atresia status post repair presenting with abdominal pain. One day duration of abdominal pain epigastric in location, sharp quality, 10/10 intensity radiation to back no known worsening relieving factors, associated nausea, vomiting nonbloody nonbilious emesis  PAST MEDICAL HISTORY:   Past Medical History  Diagnosis Date  . ASD (atrial septal defect)   . Hx SBO     PAST SURGICAL HISTORY:   Past Surgical History  Procedure Laterality Date  . Cholecystectomy    . 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 Smoking status: Never Smoker   . Smokeless tobacco: Not on file  . Alcohol Use: No    FAMILY HISTORY:   Family History  Problem Relation Age of Onset  . Diabetes Neg Hx     DRUG ALLERGIES:   Allergies  Allergen Reactions  . Ultram [Tramadol] Itching    REVIEW OF SYSTEMS:  REVIEW OF SYSTEMS:  CONSTITUTIONAL: Denies fevers, chills, fatigue, weakness.  EYES: Denies blurred vision, double vision, or eye pain.  EARS, NOSE, THROAT: Denies tinnitus, ear pain, hearing loss.  RESPIRATORY: denies cough, shortness of breath, wheezing  CARDIOVASCULAR: Denies chest pain, palpitations, edema.  GASTROINTESTINAL: Positive nausea, vomiting, , abdominal pain.  GENITOURINARY: Denies dysuria, hematuria.  ENDOCRINE: Denies nocturia or thyroid problems. HEMATOLOGIC AND LYMPHATIC: Denies easy bruising or bleeding.  SKIN: Denies rash or lesions.   MUSCULOSKELETAL: Denies pain in neck, back, shoulder, knees, hips, or further arthritic symptoms.  NEUROLOGIC: Denies paralysis, paresthesias.  PSYCHIATRIC: Denies anxiety or depressive symptoms. Otherwise full review of systems performed by me is negative.   MEDICATIONS AT HOME:   Prior to Admission medications   Not on File      VITAL SIGNS:  Blood pressure 142/104, pulse 97, temperature 97.9 F (36.6 C), temperature source Oral, resp. rate 30, height 5\' 7"  (1.702 m), weight 170 lb (77.111 kg), last menstrual period 03/03/2015, SpO2 100 %.  PHYSICAL EXAMINATION:  VITAL SIGNS: Filed Vitals:   03/28/15 0200  BP:   Pulse: 97  Temp:   Resp:    GENERAL:38 y.o.female currently in no acute distress.  HEAD: Normocephalic, atraumatic.  EYES: Pupils equal, round, reactive to light. Extraocular muscles intact. No scleral icterus.  MOUTH: Moist mucosal membrane. Dentition intact. No abscess noted.  EAR, NOSE, THROAT: Clear without exudates. No external lesions.  NECK: Supple. No thyromegaly. No nodules. No JVD.  PULMONARY: Clear to ascultation, without wheeze rails or rhonci. No use of accessory muscles, Good respiratory effort. good air entry bilaterally CHEST: Nontender to palpation.  CARDIOVASCULAR: S1 and S2. Regular rate and rhythm. No murmurs, rubs, or gallops. No edema. Pedal pulses 2+ bilaterally.  GASTROINTESTINAL: Soft, tender to palpation epigastric region with voluntary guarding no rebound no motion tenderness, nondistended. No masses. Positive bowel sounds. No hepatosplenomegaly.  MUSCULOSKELETAL: No swelling, clubbing, or edema. Range of motion full in all extremities.  NEUROLOGIC: Cranial nerves  II through XII are intact. No gross focal neurological deficits. Sensation intact. Reflexes intact.  SKIN: No ulceration, lesions, rashes, or cyanosis. Skin warm and dry. Turgor intact.  PSYCHIATRIC: Mood, affect within normal limits. The patient is awake, alert and oriented x 3.  Insight, judgment intact.    LABORATORY PANEL:   CBC  Recent Labs Lab 03/28/15 0005  WBC 18.6*  HGB 15.0  HCT 44.1  PLT 382   ------------------------------------------------------------------------------------------------------------------  Chemistries   Recent Labs Lab 03/28/15 0005  NA 137  K 3.9  CL 99*  CO2 27  GLUCOSE 175*  BUN <5*  CREATININE 0.63  CALCIUM 10.5*  AST 34  ALT 22  ALKPHOS 75  BILITOT 0.8   ------------------------------------------------------------------------------------------------------------------  Cardiac Enzymes  Recent Labs Lab 03/28/15 0005  TROPONINI <0.03   ------------------------------------------------------------------------------------------------------------------  RADIOLOGY:  Ct Abdomen Pelvis W Contrast  03/28/2015   CLINICAL DATA:  Acute onset of epigastric abdominal pain, radiating to the back. Nausea and vomiting. Initial encounter.  EXAM: CT ABDOMEN AND PELVIS WITH CONTRAST  TECHNIQUE: Multidetector CT imaging of the abdomen and pelvis was performed using the standard protocol following bolus administration of intravenous contrast.  CONTRAST:  OMNIPAQUE IOHEXOL 350 MG/ML SOLN  COMPARISON:  CT of the abdomen and pelvis from 09/28/2012  FINDINGS: Mild bibasilar atelectasis is noted.  There appears to be a small bowel interposition replacement of the esophagus, retrosternal in nature. The anastomosis with the stomach is unremarkable in appearance.  Diffuse soft tissue edema and inflammation are noted about the entirety of the pancreas, most prominent about the pancreatic head and body, with soft tissue edema and fluid tracking inferiorly anterior to the right kidney. There is no evidence of devascularization or pseudocyst formation at this time.  The liver and spleen are unremarkable in appearance. The patient is status post cholecystectomy, with clips noted at the gallbladder fossa. The pancreas and adrenal glands are  unremarkable.  The kidneys are unremarkable in appearance. There is no evidence of hydronephrosis. No renal or ureteral stones are seen. No perinephric stranding is appreciated.  No free fluid is identified. The small bowel is unremarkable in appearance. The stomach is within normal limits. No acute vascular abnormalities are seen.  The appendix is normal in caliber, without evidence of appendicitis. The colon is unremarkable in appearance.  The bladder is mildly distended and grossly unremarkable. The uterus is unremarkable in appearance. The ovaries are grossly symmetric. No suspicious adnexal masses are seen. No inguinal lymphadenopathy is seen.  No acute osseous abnormalities are identified.  IMPRESSION: 1. Diffuse soft tissue edema and inflammation about the entirety of the pancreas, most prominent about the pancreatic head and body, with soft tissue edema and fluid tracking inferiorly anterior to the right kidney, compatible with acute pancreatitis. No evidence of devascularization or pseudocyst formation at this time. 2. Small bowel interposition replacement of the esophagus, retrosternal in nature. This is grossly unremarkable in appearance. No evidence for obstruction.   Electronically Signed   By: Roanna Raider M.D.   On: 03/28/2015 01:23    EKG:   Orders placed or performed during the hospital encounter of 03/27/15  . ED EKG  . ED EKG    IMPRESSION AND PLAN:   39 year old Caucasian female presenting with abdominal pain.  1. Acute pancreatitis: History of cholecystectomy, does not drink alcohol, we will check lipid panel, IV fluid hydration, pain medication, nothing by mouth 2. Venous thromboembolism prophylactic: Heparin subcutaneous    All the records are reviewed and  case discussed with ED provider. Management plans discussed with the patient, family and they are in agreement.  CODE STATUS: Full  TOTAL TIME TAKING CARE OF THIS PATIENT: 35 minutes.    Hower,  Mardi Mainland.D on  03/28/2015 at 2:18 AM  Between 7am to 6pm - Pager - [phone]  After 6pm: House Pager: - [phone]  [facility]  Office  [phone]  CC: Primary care physician; No PCP Per Patient

## 2015-03-28 NOTE — Progress Notes (Signed)
[facility] Physicians - [place] at [facility]   PATIENT NAME: Helen Burgess    MR#:  [id number]  DATE OF BIRTH:  May 10, 1976  SUBJECTIVE:  CHIEF COMPLAINT:   Chief Complaint  Patient presents with  . Abdominal Pain   crying in pain, also feels nauseous REVIEW OF SYSTEMS:  Review of Systems  Constitutional: Negative for fever, chills, weight loss, malaise/fatigue and diaphoresis.  HENT: Negative for ear discharge, ear pain, hearing loss, nosebleeds, sore throat and tinnitus.   Eyes: Negative for blurred vision and pain.  Respiratory: Negative for cough, hemoptysis, shortness of breath and wheezing.   Cardiovascular: Negative for chest pain, palpitations, orthopnea, leg swelling and PND.  Gastrointestinal: Positive for nausea and abdominal pain. Negative for heartburn, vomiting, diarrhea, constipation and blood in stool.  Genitourinary: Negative for dysuria, urgency and frequency.  Musculoskeletal: Negative for myalgias and back pain.  Skin: Negative for itching and rash.  Neurological: Negative for dizziness, tingling, tremors, speech change, focal weakness, seizures, weakness and headaches.  Endo/Heme/Allergies: Does not bruise/bleed easily.  Psychiatric/Behavioral: Negative for depression. The patient is not nervous/anxious.    DRUG ALLERGIES:   Allergies  Allergen Reactions  . Ultram [Tramadol] Itching   VITALS:  Blood pressure 142/86, pulse 99, temperature 98.8 F (37.1 C), temperature source Oral, resp. rate 19, height 5\' 7"  (1.702 m), weight 77.111 kg (170 lb), last menstrual period 03/03/2015, SpO2 96 %. PHYSICAL EXAMINATION:  Physical Exam  Constitutional: She is oriented to person, place, and time and well-developed, well-nourished, and in no distress.  HENT:  Head: Normocephalic and atraumatic.  Eyes: Conjunctivae and EOM are normal. Pupils are equal, round, and reactive to light.  Neck: Normal range of motion. Neck supple. No tracheal deviation  present. No thyromegaly present.  Cardiovascular: Normal rate, regular rhythm and normal heart sounds.   Pulmonary/Chest: Effort normal and breath sounds normal. No respiratory distress. She has no wheezes. She exhibits no tenderness.  Abdominal: Soft. Bowel sounds are normal. She exhibits no distension. There is tenderness in the epigastric area and periumbilical area.  Musculoskeletal: Normal range of motion.  Neurological: She is alert and oriented to person, place, and time. No cranial nerve deficit.  Skin: Skin is warm and dry. No rash noted.  Psychiatric: Mood and affect normal.   LABORATORY PANEL:   CBC  Recent Labs Lab 03/28/15 0005  WBC 18.6*  HGB 15.0  HCT 44.1  PLT 382   ------------------------------------------------------------------------------------------------------------------ Chemistries   Recent Labs Lab 03/28/15 0402  NA 138  K 4.0  CL 101  CO2 29  GLUCOSE 150*  BUN <5*  CREATININE 0.51  CALCIUM 9.4  AST 24  ALT 21  ALKPHOS 75  BILITOT 0.6   RADIOLOGY:  Ct Abdomen Pelvis W Contrast  03/28/2015   CLINICAL DATA:  Acute onset of epigastric abdominal pain, radiating to the back. Nausea and vomiting. Initial encounter.  EXAM: CT ABDOMEN AND PELVIS WITH CONTRAST  TECHNIQUE: Multidetector CT imaging of the abdomen and pelvis was performed using the standard protocol following bolus administration of intravenous contrast.  CONTRAST:  OMNIPAQUE IOHEXOL 350 MG/ML SOLN  COMPARISON:  CT of the abdomen and pelvis from 09/28/2012  FINDINGS: Mild bibasilar atelectasis is noted.  There appears to be a small bowel interposition replacement of the esophagus, retrosternal in nature. The anastomosis with the stomach is unremarkable in appearance.  Diffuse soft tissue edema and inflammation are noted about the entirety of the pancreas, most prominent about the pancreatic head and body,  with soft tissue edema and fluid tracking inferiorly anterior to the right kidney.  There is no evidence of devascularization or pseudocyst formation at this time.  The liver and spleen are unremarkable in appearance. The patient is status post cholecystectomy, with clips noted at the gallbladder fossa. The pancreas and adrenal glands are unremarkable.  The kidneys are unremarkable in appearance. There is no evidence of hydronephrosis. No renal or ureteral stones are seen. No perinephric stranding is appreciated.  No free fluid is identified. The small bowel is unremarkable in appearance. The stomach is within normal limits. No acute vascular abnormalities are seen.  The appendix is normal in caliber, without evidence of appendicitis. The colon is unremarkable in appearance.  The bladder is mildly distended and grossly unremarkable. The uterus is unremarkable in appearance. The ovaries are grossly symmetric. No suspicious adnexal masses are seen. No inguinal lymphadenopathy is seen.  No acute osseous abnormalities are identified.  IMPRESSION: 1. Diffuse soft tissue edema and inflammation about the entirety of the pancreas, most prominent about the pancreatic head and body, with soft tissue edema and fluid tracking inferiorly anterior to the right kidney, compatible with acute pancreatitis. No evidence of devascularization or pseudocyst formation at this time. 2. Small bowel interposition replacement of the esophagus, retrosternal in nature. This is grossly unremarkable in appearance. No evidence for obstruction.   Electronically Signed   By: Roanna Raider M.D.   On: 03/28/2015 01:23   ASSESSMENT AND PLAN:  39 year old Caucasian female presenting with abdominal pain.  1. Acute pancreatitis: History of cholecystectomy, does not drink alcohol, lipid panels also not looking bad, continue IV fluid hydration, increase pain medication - Dilaudid every 2 hours to keep her comfortable, nothing by mouth for now.  We will consult GI.  Discussed with Dr. Mechele Collin  2.  Leukocytosis: Likely from  pancreatitis  3.  Hypertension/tachycardia: Likely from inadequate pain control, will monitor  4.  Hyperglycemia: Check hemoglobin A1c  Venous thromboembolism prophylactic: Heparin subcutaneous   All the records are reviewed and case discussed with Care Management/Social Worker and GI Dr. Mechele Collin. Management plans discussed with the patient and she is in agreement.  CODE STATUS: Full Code  TOTAL TIME TAKING CARE OF THIS PATIENT: 35 minutes.   More than 50% of the time was spent in counseling/coordination of care: YES  POSSIBLE D/C IN 2-3 DAYS, DEPENDING ON CLINICAL CONDITION and GI evaluation   [facility], Nawaf Strange M.D on 03/28/2015 at 12:29 PM  Between 7am to 6pm - Pager - [phone]  After 6pm go to [email or web address] - password [email or web address] [facility]  [facility]  Office  [phone]  CC:  Primary care physician; No PCP Per Patient

## 2015-03-29 ENCOUNTER — Inpatient Hospital Stay: Payer: 59

## 2015-03-29 LAB — BASIC METABOLIC PANEL
Anion gap: 7 (ref 5–15)
BUN: <5 mg/dL — ABNORMAL LOW (ref 6–20)
CO2: 27 mmol/L (ref 22–32)
Calcium: 8.1 mg/dL — ABNORMAL LOW (ref 8.9–10.3)
Chloride: 102 mmol/L (ref 101–111)
Creatinine, Ser: 0.48 mg/dL (ref 0.44–1.00)
GFR calc Af Amer: >60 mL/min (ref >60)
GFR calc non Af Amer: >60 mL/min (ref >60)
Glucose, Bld: 110 mg/dL — ABNORMAL HIGH (ref 65–99)
Potassium: 3.8 mmol/L (ref 3.5–5.1)
Sodium: 136 mmol/L (ref 135–145)

## 2015-03-29 LAB — CBC
HEMATOCRIT: 38.4 % (ref 35.0–47.0)
Hemoglobin: 12.6 g/dL (ref 12.0–16.0)
MCH: 28.8 pg (ref 26.0–34.0)
MCHC: 32.8 g/dL (ref 32.0–36.0)
MCV: 87.7 fL (ref 80.0–100.0)
Platelets: 285 10*3/uL (ref 150–440)
RBC: 4.37 MIL/uL (ref 3.80–5.20)
RDW: 13.5 % (ref 11.5–14.5)
WBC: 16.3 10*3/uL — ABNORMAL HIGH (ref 3.6–11.0)

## 2015-03-29 LAB — LIPID PANEL
Cholesterol: 122 mg/dL (ref 0–200)
HDL: 21 mg/dL — ABNORMAL LOW (ref >40)
LDL Cholesterol: 59 mg/dL (ref 0–99)
Total CHOL/HDL Ratio: 5.8 RATIO
Triglycerides: 211 mg/dL — ABNORMAL HIGH (ref <150)
VLDL: 42 mg/dL — ABNORMAL HIGH (ref 0–40)

## 2015-03-29 LAB — LIPASE, BLOOD: Lipase: 125 U/L — ABNORMAL HIGH (ref 22–51)

## 2015-03-29 LAB — HEMOGLOBIN A1C: HEMOGLOBIN A1C: 5.7 % (ref 4.0–6.0)

## 2015-03-29 LAB — IGG 4: IgG, Subclass 4: 13 mg/dL (ref 1–291)

## 2015-03-29 LAB — BRAIN NATRIURETIC PEPTIDE: B Natriuretic Peptide: 78 pg/mL (ref 0.0–100.0)

## 2015-03-29 MED ORDER — LEVALBUTEROL HCL 1.25 MG/0.5ML IN NEBU: 1.2500 mg | INHALATION_SOLUTION | Freq: Four times a day (QID) | RESPIRATORY_TRACT | Status: DC | PRN

## 2015-03-29 MED ORDER — PIPERACILLIN-TAZOBACTAM 3.375 G IVPB
3.3750 g | Freq: Three times a day (TID) | INTRAVENOUS | Status: DC
  Administered 2015-03-29 – 2015-03-30 (×2): 3.375 g via INTRAVENOUS
  Filled 2015-03-29 (×6): qty 50

## 2015-03-29 MED ORDER — GADOBENATE DIMEGLUMINE 529 MG/ML IV SOLN
20.0000 mL | Freq: Once | INTRAVENOUS | Status: AC | PRN
  Administered 2015-03-29: 16 mL via INTRAVENOUS

## 2015-03-29 MED ORDER — FUROSEMIDE 10 MG/ML IJ SOLN
40.0000 mg | Freq: Once | INTRAMUSCULAR | Status: AC
  Administered 2015-03-29: 40 mg via INTRAVENOUS
  Filled 2015-03-29: qty 4

## 2015-03-29 MED ORDER — ALBUTEROL SULFATE (2.5 MG/3ML) 0.083% IN NEBU: 2.5000 mg | INHALATION_SOLUTION | RESPIRATORY_TRACT | Status: DC | PRN

## 2015-03-29 NOTE — Progress Notes (Signed)
Dr. Sheryle Hail notified that Pt's O2 was increased  to 4Lpm d/t sats being at 88% and is now at 90%. Pt c/o of dyspnea and congestion. Also noted right arm beginning to swell.  MD ordered for fluid to decrease to 125 ml/hr.

## 2015-03-29 NOTE — Progress Notes (Signed)
Pt. Alert and oriented. VSS. Pt. Has been c/o severe pain in abdomen and back. Back pain started last night. Pain controlled with IV pain meds. IVF's changed to SL. Up ambulating to bathroom. Pt. Went for MRCP today. Results pending. NPO. Afebrile today. Resting quietly.

## 2015-03-29 NOTE — Progress Notes (Signed)
[facility] -  at [facility]   PATIENT NAME: Helen Burgess    MR#:  [id number]  DATE OF BIRTH:  1976-03-29  SUBJECTIVE:  CHIEF COMPLAINT:   Chief Complaint  Patient presents with  . Abdominal Pain   continues to complain of abdominal pain, complains of swelling in her hands, on 4 L of oxygen  REVIEW OF SYSTEMS:  Review of Systems  Constitutional: Negative for fever, chills, weight loss, malaise/fatigue and diaphoresis.  HENT: Negative for ear discharge, ear pain, hearing loss, nosebleeds, sore throat and tinnitus.   Eyes: Negative for blurred vision and pain.  Respiratory: Negative for cough, hemoptysis, positive shortness of breath no wheezing.   Cardiovascular: Negative for chest pain, palpitations, orthopnea, leg swelling and PND.  Gastrointestinal: Positive for nausea and abdominal pain. Negative for heartburn, vomiting, diarrhea, constipation and blood in stool.  Genitourinary: Negative for dysuria, urgency and frequency.  Musculoskeletal: Negative for myalgias and back pain.  Skin: Negative for itching and rash.  Neurological: Negative for dizziness, tingling, tremors, speech change, focal weakness, seizures, weakness and headaches.  Endo/Heme/Allergies: Does not bruise/bleed easily.  Psychiatric/Behavioral: Negative for depression. The patient is not nervous/anxious.    DRUG ALLERGIES:   Allergies  Allergen Reactions  . Ultram [Tramadol] Itching   VITALS:  Blood pressure 131/84, pulse 104, temperature 99.4 F (37.4 C), temperature source Oral, resp. rate 20, height  (1.702 m), weight 77.111 kg (170 lb), last menstrual period 03/03/2015, SpO2 94 %. PHYSICAL EXAMINATION:  Physical Exam  Constitutional: She is oriented to person, place, and time and well-developed, well-nourished, and in no distress.  HENT:  Head: Normocephalic and atraumatic.  Eyes: Conjunctivae and EOM are normal. Pupils are equal, round, and reactive to light.   Neck: Normal range of motion. Neck supple. No tracheal deviation present. No thyromegaly present.  Cardiovascular: Normal rate, regular rhythm and normal heart sounds.   Pulmonary/Chest: Mild respiratory distress bilateral crackles throughout both lungs  Abdominal: Soft. Bowel sounds are normal. She exhibits no distension. There is tenderness in the epigastric area and periumbilical area.  Musculoskeletal: Normal range of motion.  Neurological: She is alert and oriented to person, place, and time. No cranial nerve deficit.  Skin: Skin is warm and dry. No rash noted.  Psychiatric: Mood and affect normal.   LABORATORY PANEL:   CBC  Recent Labs Lab 03/29/15 0400  WBC 16.3*  HGB 12.6  HCT 38.4  PLT 285   ------------------------------------------------------------------------------------------------------------------ Chemistries   Recent Labs Lab 03/28/15 1247 03/29/15 0400  NA 136 136  K 4.2 3.8  CL 101 102  CO2 27 27  GLUCOSE 143* 110*  BUN <5* <5*  CREATININE 0.47 0.48  CALCIUM 8.5* 8.1*  AST 23  --   ALT 17  --   ALKPHOS 64  --   BILITOT 0.7  --    RADIOLOGY:  No results found. ASSESSMENT AND PLAN:  39 year old Caucasian female presenting with abdominal pain.  1. Acute pancreatitis: History of cholecystectomy, does not drink alcohol,  MRCP pending this morning. Possibly use due to NSAID use  2.  Leukocytosis: Likely from pancreatitis  3.  Hypertension/tachycardia: Likely from inadequate pain control, blood pressure improved  4. Acute respiratory failure check a stat chest x-ray she appears to be fluid overloaded him on a stop her IV fluids check a BMP  Venous thromboembolism prophylactic: Heparin subcutaneous   All the records are reviewed and case discussed with Care Management/Social Worker and GI Dr. Mechele Collin. Management  plans discussed with the patient and she is in agreement.  CODE STATUS: Full Code  TOTAL TIME TAKING CARE OF THIS PATIENT: 35  minutes.   More than 50% of the time was spent in counseling/coordination of care: YES  POSSIBLE D/C IN 2-3 DAYS, DEPENDING ON CLINICAL CONDITION and GI evaluation   Auburn Bilberry M.D on 03/29/2015 at 1:09 PM  Between 7am to 6pm - Pager - [phone]  After 6pm go to [email or web address] - password [email or web address] [employer] [facility]  [facility]  Office  [phone]  CC:  Primary care physician; No PCP Per Patient

## 2015-03-29 NOTE — Progress Notes (Signed)
ANTIBIOTIC CONSULT NOTE - INITIAL  Pharmacy Consult for Zosyn dosing Indication: pneumonia  Allergies  Allergen Reactions  . Ultram [Tramadol] Itching    Patient Measurements: Height:  (170.2 cm) Weight: 170 lb (77.111 kg) IBW/kg (Calculated) : 61.6 Adjusted Body Weight: n/a  Vital Signs: Temp: 99.4 F (37.4 C) (08/13 1621) Temp Source: Oral (08/13 1621) BP: 132/76 mmHg (08/13 1621) Pulse Rate: 112 (08/13 1838) Intake/Output from previous day: 08/12 0701 - 08/13 0700 In: 3633.3 [I.V.:3633.3] Out: 250 [Urine:250] Intake/Output from this shift:    Labs:  Recent Labs  03/28/15 0005 03/28/15 0402 03/28/15 1247 03/29/15 0400  WBC 18.6*  --   --  16.3*  HGB 15.0  --   --  12.6  PLT 382  --   --  285  CREATININE 0.63 0.51 0.47 0.48   Estimated Creatinine Clearance: 103.1 mL/min (by C-G formula based on Cr of 0.48). No results for input(s): VANCOTROUGH, VANCOPEAK, VANCORANDOM, GENTTROUGH, GENTPEAK, GENTRANDOM, TOBRATROUGH, TOBRAPEAK, TOBRARND, AMIKACINPEAK, AMIKACINTROU, AMIKACIN in the last 72 hours.   Microbiology: No results found for this or any previous visit (from the past 720 hour(s)).  Medical History: Past Medical History  Diagnosis Date  . ASD (atrial septal defect)   . Hx SBO     Medications:   Assessment: CXR: possible bilateral pneumonia  Goal of Therapy:  Resolve infection  Plan:  Zosyn 3.375 grams q 8 hours ordered.  Levan Aloia S 03/29/2015,9:29 PM

## 2015-03-29 NOTE — Progress Notes (Signed)
Called and gave Dr. Imogene Burn report on chest x-ray. Start pt. On zosyn and nebs.

## 2015-03-29 NOTE — Progress Notes (Addendum)
Pt. Has coarse crackles in left lung. Dr. Allena Katz ordered lasix  IV x 1 and chest x-ray

## 2015-03-30 ENCOUNTER — Inpatient Hospital Stay
Admit: 2015-03-30 | Discharge: 2015-03-30 | Disposition: A | Discharge status: Home / Self Care | Payer: 59 | Attending: Internal Medicine | Admitting: Internal Medicine

## 2015-03-30 LAB — BASIC METABOLIC PANEL
ANION GAP: 12 (ref 5–15)
BUN: 6 mg/dL (ref 6–20)
CALCIUM: 8.7 mg/dL — AB (ref 8.9–10.3)
CO2: 30 mmol/L (ref 22–32)
Chloride: 94 mmol/L — ABNORMAL LOW (ref 101–111)
Creatinine, Ser: 0.47 mg/dL (ref 0.44–1.00)
GFR calc non Af Amer: >60 mL/min (ref >60)
GLUCOSE: 130 mg/dL — AB (ref 65–99)
Potassium: 3.5 mmol/L (ref 3.5–5.1)
Sodium: 136 mmol/L (ref 135–145)

## 2015-03-30 LAB — CBC WITH DIFFERENTIAL/PLATELET
BASOS ABS: 0 10*3/uL (ref 0–0.1)
BASOS PCT: 0 %
Eosinophils Absolute: 0 10*3/uL (ref 0–0.7)
Eosinophils Relative: 0 %
HCT: 40.5 % (ref 35.0–47.0)
HEMOGLOBIN: 13.5 g/dL (ref 12.0–16.0)
Lymphocytes Relative: 7 %
Lymphs Abs: 1.7 10*3/uL (ref 1.0–3.6)
MCH: 28.6 pg (ref 26.0–34.0)
MCHC: 33.2 g/dL (ref 32.0–36.0)
MCV: 86.1 fL (ref 80.0–100.0)
MONO ABS: 1 10*3/uL — AB (ref 0.2–0.9)
Monocytes Relative: 4 %
NEUTROS PCT: 89 %
Neutro Abs: 21.7 10*3/uL — ABNORMAL HIGH (ref 1.4–6.5)
PLATELETS: 295 10*3/uL (ref 150–440)
RBC: 4.7 MIL/uL (ref 3.80–5.20)
RDW: 13.3 % (ref 11.5–14.5)
WBC: 24.4 10*3/uL — ABNORMAL HIGH (ref 3.6–11.0)

## 2015-03-30 LAB — LIPASE, BLOOD: LIPASE: 25 U/L (ref 22–51)

## 2015-03-30 MED ORDER — LEVOFLOXACIN IN D5W 500 MG/100ML IV SOLN
500.0000 mg | INTRAVENOUS | Status: DC
  Administered 2015-03-30 – 2015-03-31 (×2): 500 mg via INTRAVENOUS
  Filled 2015-03-30 (×4): qty 100

## 2015-03-30 MED ORDER — FUROSEMIDE 10 MG/ML IJ SOLN
40.0000 mg | Freq: Two times a day (BID) | INTRAMUSCULAR | Status: DC
  Administered 2015-03-30 – 2015-03-31 (×3): 40 mg via INTRAVENOUS
  Filled 2015-03-30 (×3): qty 4

## 2015-03-30 NOTE — Consult Note (Signed)
Pt with improvement with better breath sounds in lung bases, abd less tender, her lipase has come down to normal.  She likely passed a small stone from her common bile duct before the MRCP was done.  She has leukocytosis up to 25K may be due pneumonia, I agree with Levaquin.  Temp 100.9 and tachycardia.  She is using incentive spirometry.

## 2015-03-30 NOTE — Progress Notes (Signed)
*  PRELIMINARY RESULTS* Echocardiogram 2D Echocardiogram has been performed.  Helen Burgess 03/30/2015, 9:29 AM

## 2015-03-30 NOTE — Progress Notes (Signed)
[facility] - [address] at [facility]   PATIENT NAME: Helen Burgess    MR#:  [id number]  DATE OF BIRTH:  1976/05/12  SUBJECTIVE:  CHIEF COMPLAINT:   Chief Complaint  Patient presents with  . Abdominal Pain   Abdominal pain  Resolved, sob improved denies any cp REVIEW OF SYSTEMS:  Review of Systems  Constitutional: Negative for fever, chills, weight loss, malaise/fatigue and diaphoresis.  HENT: Negative for ear discharge, ear pain, hearing loss, nosebleeds, sore throat and tinnitus.   Eyes: Negative for blurred vision and pain.  Respiratory: Negative for cough, hemoptysis, positive shortness of breath no wheezing.   Cardiovascular: Negative for chest pain, palpitations, orthopnea, leg swelling and PND.  Gastrointestinal: nausea and abominal pain resolved Negative for heartburn, vomiting, diarrhea, constipation and blood in stool.  Genitourinary: Negative for dysuria, urgency and frequency.  Musculoskeletal: Negative for myalgias and back pain.  Skin: Negative for itching and rash.  Neurological: Negative for dizziness, tingling, tremors, speech change, focal weakness, seizures, weakness and headaches.  Endo/Heme/Allergies: Does not bruise/bleed easily.  Psychiatric/Behavioral: Negative for depression. The patient is not nervous/anxious.    DRUG ALLERGIES:   Allergies  Allergen Reactions  . Ultram [Tramadol] Itching   VITALS:  Blood pressure 137/84, pulse 107, temperature 99.1 F (37.3 C), temperature source Oral, resp. rate 22, height 5\' 7"  (1.702 m), weight 77.111 kg (170 lb), last menstrual period 03/03/2015, SpO2 91 %. PHYSICAL EXAMINATION:  Physical Exam  Constitutional: She is oriented to person, place, and time and well-developed, well-nourished, and in no distress.  HENT:  Head: Normocephalic and atraumatic.  Eyes: Conjunctivae and EOM are normal. Pupils are equal, round, and reactive to light.  Neck: Normal range of motion. Neck supple. No  tracheal deviation present. No thyromegaly present.  Cardiovascular: Normal rate, regular rhythm and normal heart sounds.   Pulmonary/Chest: Mild respiratory distress bilateral crackles throughout both lungs  Abdominal: Soft. Bowel sounds are normal. She exhibits no distension. Non tender Musculoskeletal: Normal range of motion.  Neurological: She is alert and oriented to person, place, and time. No cranial nerve deficit.  Skin: Skin is warm and dry. No rash noted.  Psychiatric: Mood and affect normal.   LABORATORY PANEL:   CBC  Recent Labs Lab 03/30/15 0824  WBC 24.4*  HGB 13.5  HCT 40.5  PLT 295   ------------------------------------------------------------------------------------------------------------------ Chemistries   Recent Labs Lab 03/28/15 1247  03/30/15 0824  NA 136  < > 136  K 4.2  < > 3.5  CL 101  < > 94*  CO2 27  < > 30  GLUCOSE 143*  < > 130*  BUN <5*  < > 6  CREATININE 0.47  < > 0.47  CALCIUM 8.5*  < > 8.7*  AST 23  --   --   ALT 17  --   --   ALKPHOS 64  --   --   BILITOT 0.7  --   --   < > = values in this interval not displayed. RADIOLOGY:  Dg Chest 1 View  03/29/2015   CLINICAL DATA:  Pancreatitis  EXAM: CHEST  1 VIEW  COMPARISON:  2/18/ 10  FINDINGS: Cardiomegaly is noted. There is diffuse airspace right lung. Airspace disease is noted left perihilar and left lower lobe. Findings suspicious for asymmetric pneumonia or asymmetric pulmonary edema. Central mild vascular congestion. Question trace right pleural effusion.  IMPRESSION: Diffuse airspace disease bilaterally with sparing of the left upper lobe . Findings suspicious for asymmetric  pneumonia or asymmetric pulmonary edema. Central vascular congestion. Question trace right pleural effusion.   Electronically Signed   By: Natasha Mead M.D.   On: 03/29/2015 18:01   ASSESSMENT AND PLAN:  39 year old Caucasian female presenting with abdominal pain.  1. Acute pancreatitis: History of cholecystectomy,  does not drink alcohol,  MRCP  Result pending Possibly use due to NSAID use clear liquid diet  2.  Leukocytosis: Likely from pancreatitis cxr with infilatrate change abx to levaquin  3.  Hypertension/tachycardia: Likely from inadequate pain control, blood pressure improved  4. Acute respiratory failure  Due to acute chf type unknow, iv lasix, echo  Venous thromboembolism prophylactic: Heparin subcutaneous   All the records are reviewed and case discussed with Care Management/Social Worker and GI Dr. Mechele Collin. Management plans discussed with the patient and she is in agreement.  CODE STATUS: Full Code  TOTAL TIME TAKING CARE OF THIS PATIENT: .        Auburn Bilberry M.D on 03/30/2015 at 9:41 AM  Between 7am to 6pm - Pager - [phone]  After 6pm go to [email or web address] - password [email or web address] [facility]  [place] [facility]  Office  [phone]  CC:  Primary care physician; No PCP Per Patient

## 2015-03-31 MED ORDER — DOCUSATE SODIUM 100 MG PO CAPS
100.0000 mg | ORAL_CAPSULE | Freq: Two times a day (BID) | ORAL | Status: DC
  Administered 2015-03-31 – 2015-04-03 (×4): 100 mg via ORAL
  Filled 2015-03-31 (×8): qty 1

## 2015-03-31 MED ORDER — FUROSEMIDE 10 MG/ML IJ SOLN
20.0000 mg | Freq: Two times a day (BID) | INTRAMUSCULAR | Status: DC
  Administered 2015-03-31 – 2015-04-01 (×2): 20 mg via INTRAVENOUS
  Filled 2015-03-31 (×2): qty 2

## 2015-03-31 NOTE — Progress Notes (Signed)
[facility] Physicians - [place] at [facility]   PATIENT NAME: Helen Burgess    MR#:  [id number]  DATE OF BIRTH:  September 18, 1975  SUBJECTIVE:  CHIEF COMPLAINT:   Chief Complaint  Patient presents with  . Abdominal Pain   Breathing improved, has some cough had fevers last night denies any chest, some nausea REVIEW OF SYSTEMS:  Review of Systems  Constitutional: Positive fever, chills, weight loss, malaise/fatigue and diaphoresis.  HENT: Negative for ear discharge, ear pain, hearing loss, nosebleeds, sore throat and tinnitus.   Eyes: Negative for blurred vision and pain.  Respiratory: Negative for cough, hemoptysis, positive shortness of breath no wheezing.   Cardiovascular: Negative for chest pain, palpitations, orthopnea, leg swelling and PND.  Gastrointestinal: Positive nausea and no abominal pain resolved Negative for heartburn, vomiting, diarrhea, constipation and blood in stool.  Genitourinary: Negative for dysuria, urgency and frequency.  Musculoskeletal: Negative for myalgias and back pain.  Skin: Negative for itching and rash.  Neurological: Negative for dizziness, tingling, tremors, speech change, focal weakness, seizures, weakness and headaches.  Endo/Heme/Allergies: Does not bruise/bleed easily.  Psychiatric/Behavioral: Negative for depression. The patient is not nervous/anxious.    DRUG ALLERGIES:   Allergies  Allergen Reactions  . Ultram [Tramadol] Itching   VITALS:  Blood pressure 127/76, pulse 101, temperature 98.8 F (37.1 C), temperature source Oral, resp. rate 18, height 5\' 7"  (1.702 m), weight 77.111 kg (170 lb), last menstrual period 03/03/2015, SpO2 94 %. PHYSICAL EXAMINATION:  Physical Exam  Constitutional: She is oriented to person, place, and time and well-developed, well-nourished, and in no distress.  HENT:  Head: Normocephalic and atraumatic.  Eyes: Conjunctivae and EOM are normal. Pupils are equal, round, and reactive to light.  Neck:  Normal range of motion. Neck supple. No tracheal deviation present. No thyromegaly present.  Cardiovascular: Normal rate, regular rhythm and normal heart sounds.   Pulmonary/Chest: Mild respiratory distress bilateral crackles throughout both lungs  Abdominal: Soft. Bowel sounds are normal. She exhibits no distension. Non tender Musculoskeletal: Normal range of motion.  Neurological: She is alert and oriented to person, place, and time. No cranial nerve deficit.  Skin: Skin is warm and dry. No rash noted.  Psychiatric: Mood and affect normal.   LABORATORY PANEL:   CBC  Recent Labs Lab 03/30/15 0824  WBC 24.4*  HGB 13.5  HCT 40.5  PLT 295   ------------------------------------------------------------------------------------------------------------------ Chemistries   Recent Labs Lab 03/28/15 1247  03/30/15 0824  NA 136  < > 136  K 4.2  < > 3.5  CL 101  < > 94*  CO2 27  < > 30  GLUCOSE 143*  < > 130*  BUN <5*  < > 6  CREATININE 0.47  < > 0.47  CALCIUM 8.5*  < > 8.7*  AST 23  --   --   ALT 17  --   --   ALKPHOS 64  --   --   BILITOT 0.7  --   --   < > = values in this interval not displayed. RADIOLOGY:  No results found. ASSESSMENT AND PLAN:  39 year old Caucasian female presenting with abdominal pain.  1. Acute pancreatitis: History of cholecystectomy, does not drink alcohol,  MRCP no significant pathology , suspected use due to NSAID clear liquid diet advance to low-fat likely tomorrow  2.  Leukocytosis: Likely from pancreatitis as well possible pneumonia, and 2 Levaquin  3.  Hypertension/tachycardia: Likely from inadequate pain control, blood pressure improved  4. Acute respiratory  failure  Due to acute chf likely diastolic echo with a normal EF decrease IV Lasix good urine output   Venous thromboembolism prophylactic: Heparin subcutaneous   All the records are reviewed and case discussed with Care Management/Social Worker and GI Dr. Mechele Collin. Management plans  discussed with the patient and she is in agreement.  CODE STATUS: Full Code  TOTAL TIME TAKING CARE OF THIS PATIENT: .        Auburn Bilberry M.D on 03/31/2015 at 2:00 PM  Between 7am to 6pm - Pager - [phone]  After 6pm go to [email or web address] - password [email or web address] [facility]  [place] [facility]  Office  [phone]  CC:  Primary care physician; No PCP Per Patient

## 2015-03-31 NOTE — Consult Note (Signed)
Pt had temp spike last nite, blood cult neg,  VSS today,  Pt chest exam shows better breathing.  Will try low fat diet.  Pt looks like she is feeling better, maybe home tomorrow if no other problems.

## 2015-04-01 ENCOUNTER — Inpatient Hospital Stay: Payer: 59

## 2015-04-01 LAB — BASIC METABOLIC PANEL
ANION GAP: 12 (ref 5–15)
Anion gap: 14 (ref 5–15)
BUN: 12 mg/dL (ref 6–20)
BUN: 15 mg/dL (ref 6–20)
CALCIUM: 8.6 mg/dL — AB (ref 8.9–10.3)
CALCIUM: 9.1 mg/dL (ref 8.9–10.3)
CO2: 36 mmol/L — ABNORMAL HIGH (ref 22–32)
CO2: 31 mmol/L (ref 22–32)
CREATININE: 0.5 mg/dL (ref 0.44–1.00)
Chloride: 86 mmol/L — ABNORMAL LOW (ref 101–111)
Chloride: 92 mmol/L — ABNORMAL LOW (ref 101–111)
Creatinine, Ser: 0.61 mg/dL (ref 0.44–1.00)
GFR calc Af Amer: >60 mL/min (ref >60)
GFR calc non Af Amer: >60 mL/min (ref >60)
GLUCOSE: 207 mg/dL — AB (ref 65–99)
Glucose, Bld: 155 mg/dL — ABNORMAL HIGH (ref 65–99)
POTASSIUM: 3.5 mmol/L (ref 3.5–5.1)
Potassium: 2.4 mmol/L — CL (ref 3.5–5.1)
SODIUM: 136 mmol/L (ref 135–145)
SODIUM: 135 mmol/L (ref 135–145)

## 2015-04-01 LAB — CBC
HCT: 37.8 % (ref 35.0–47.0)
Hemoglobin: 12.6 g/dL (ref 12.0–16.0)
MCH: 28.5 pg (ref 26.0–34.0)
MCHC: 33.4 g/dL (ref 32.0–36.0)
MCV: 85.5 fL (ref 80.0–100.0)
PLATELETS: 359 10*3/uL (ref 150–440)
RBC: 4.42 MIL/uL (ref 3.80–5.20)
RDW: 13.4 % (ref 11.5–14.5)
WBC: 15.2 10*3/uL — AB (ref 3.6–11.0)

## 2015-04-01 MED ORDER — POTASSIUM CHLORIDE CRYS ER 20 MEQ PO TBCR
40.0000 meq | EXTENDED_RELEASE_TABLET | Freq: Two times a day (BID) | ORAL | Status: DC
  Filled 2015-04-01: qty 2

## 2015-04-01 MED ORDER — FUROSEMIDE 20 MG PO TABS
20.0000 mg | ORAL_TABLET | Freq: Two times a day (BID) | ORAL | Status: DC
  Administered 2015-04-01 – 2015-04-03 (×4): 20 mg via ORAL
  Filled 2015-04-01 (×4): qty 1

## 2015-04-01 MED ORDER — POTASSIUM CHLORIDE 10 MEQ/100ML IV SOLN
10.0000 meq | INTRAVENOUS | Status: DC
  Administered 2015-04-01: 10 meq via INTRAVENOUS
  Filled 2015-04-01 (×4): qty 100

## 2015-04-01 MED ORDER — POLYETHYLENE GLYCOL 3350 17 G PO PACK
17.0000 g | PACK | Freq: Every day | ORAL | Status: DC
  Administered 2015-04-01 – 2015-04-02 (×2): 17 g via ORAL
  Filled 2015-04-01 (×3): qty 1

## 2015-04-01 MED ORDER — POTASSIUM CHLORIDE CRYS ER 20 MEQ PO TBCR
60.0000 meq | EXTENDED_RELEASE_TABLET | Freq: Four times a day (QID) | ORAL | Status: AC
  Administered 2015-04-01 (×4): 60 meq via ORAL
  Filled 2015-04-01 (×4): qty 3

## 2015-04-01 MED ORDER — LEVOFLOXACIN 500 MG PO TABS
500.0000 mg | ORAL_TABLET | Freq: Every day | ORAL | Status: DC
  Administered 2015-04-01 – 2015-04-02 (×2): 500 mg via ORAL
  Filled 2015-04-01 (×2): qty 1

## 2015-04-01 NOTE — Progress Notes (Signed)
Nutrition Follow-up   INTERVENTION:   Meals and Snacks: Cater to patient preferences Education:  RD provided "Pancreatitis Nutrition Therapy" handout from the Academy of [employer]. Discussed nutrition therapy to reduce the irritation of the pancreas to promote digestion and absorption of nutrients. Provided list of recommended low fat foods as well as a recommended sample day. Teach back method used. Expect good compliance as pt reports eating low fat PTA as she has been intentionally trying to lose weight.  NUTRITION DIAGNOSIS:   Inadequate oral intake related to inability to eat as evidenced by NPO status, improved with diet order advancement  GOAL:   Patient will meet greater than or equal to 90% of their needs; ongoing  MONITOR:    (Energy Intake, Gastrointestinal Profile, Anthropometrics)   ASSESSMENT:   Pt admitted with acute pancreatitis. Per MD note, pt passed stone from small bile duct before MRCP, MD recommending low fat diet.  Diet Order:  Diet Heart Room service appropriate?: Yes; Fluid consistency:: Thin    Current Nutrition: Pt reports eating 100% of breakfast this am including corn flakes, milk and juice. Pt reports tolerating solids well. RD notes pt eating 70% of dinner last night as well.    Gastrointestinal Profile: Last BM: 03/27/2015   Medications: colace, lasix, KCl   Electrolyte/Renal Profile and Glucose Profile:   Recent Labs Lab 03/29/15 0400 03/30/15 0824 04/01/15 0433  NA 136 136 136  K 3.8 3.5 2.4*  CL 102 94* 86*  CO2 27 30 36*  BUN <5* 6 12  CREATININE 0.48 0.47 0.50  CALCIUM 8.1* 8.7* 8.6*  GLUCOSE 110* 130* 155*   Protein Profile:   Recent Labs Lab 03/28/15 0005 03/28/15 0402 03/28/15 1247  ALBUMIN 4.0 3.7 3.2*   Weight Trend PTA: Pt reports intentional weight loss PTA of over 20lbs in the past 3 weeks  Weight Trend since Admission: Filed Weights   03/27/15 2351  Weight: 170 lb (77.111 kg)     BMI:   Body mass index is 26.62 kg/(m^2).  Estimated Nutritional Needs:   Kcal:  2128-2514kcals, BEE: 1488kcals, TEE: (IF 1.1-1.3)(AF 1.3)   Protein:  61-77g protein (0.8-1.0g/kg)   Fluid:  1925-2320mL of fluid (25-76mL/kg)  EDUCATION NEEDS:   Education needs addressed   LOW Care Level  Leda Quail, RD, LDN Pager [phone]

## 2015-04-01 NOTE — Progress Notes (Signed)
Pt potassium dropped from 3.5 on Sunday 8/14 to 2.4 this AM. Called MD to order replacement. MD will enter order.

## 2015-04-01 NOTE — Progress Notes (Signed)
Pt. Alert and oriented. VSS. Pain controlled with PO pain meds. Pts. Received potassium replacement with PO potassium. Up ambulating to bathroom. Tolerating meals. Will continue to monitor.

## 2015-04-01 NOTE — Consult Note (Signed)
I think she can go home if you are satisfied with her oxygenation.  CXR signif improved.  I think most likely she passed a stone given the abrupt onset and the abrupt normalization of lipase level.  Therefore would recommend Actigal  bid and follow up with me in office in 2-3 months.

## 2015-04-01 NOTE — Care Management (Signed)
Met with patient to discuss home O2. She stated that O2 is new. She does not have PCP. I offered to find one for her and she refused. She said, "I'm not too impressed with anybody I've seen in here". I offered [place]- she refused. RN unable to wean O2. Chronic lung diagnosis? RNCM will continue to follow for O2 need.

## 2015-04-01 NOTE — Progress Notes (Signed)
[facility] Physicians - [place] at [facility]   PATIENT NAME: Helen Burgess    MR#:  [id number]  DATE OF BIRTH:  [birth year]  SUBJECTIVE:  CHIEF COMPLAINT:   Chief Complaint  Patient presents with  . Abdominal Pain   patient's potassium is critically low at 2.4 her breathing is improved but on 2 L chest x-ray shows improvement as well REVIEW OF SYSTEMS:  Review of Systems  Constitutional: Positive fever, chills, weight loss, malaise/fatigue and diaphoresis.  HENT: Negative for ear discharge, ear pain, hearing loss, nosebleeds, sore throat and tinnitus.   Eyes: Negative for blurred vision and pain.  Respiratory: Negative for cough, hemoptysis, positive shortness of breath no wheezing.   Cardiovascular: Negative for chest pain, palpitations, orthopnea, leg swelling and PND.  Gastrointestinal: Positive nausea and no abominal pain resolved Negative for heartburn, vomiting, diarrhea, constipation and blood in stool.  Genitourinary: Negative for dysuria, urgency and frequency.  Musculoskeletal: Negative for myalgias and back pain.  Skin: Negative for itching and rash.  Neurological: Negative for dizziness, tingling, tremors, speech change, focal weakness, seizures, weakness and headaches.  Endo/Heme/Allergies: Does not bruise/bleed easily.  Psychiatric/Behavioral: Negative for depression. The patient is not nervous/anxious.    DRUG ALLERGIES:   Allergies  Allergen Reactions  . Ultram [Tramadol] Itching   VITALS:  Blood pressure 112/75, pulse 97, temperature 98 F (36.7 C), temperature source Oral, resp. rate 18, height  (1.702 m), weight 77.111 kg (170 lb), last menstrual period 03/03/2015, SpO2 95 %. PHYSICAL EXAMINATION:  Physical Exam  Constitutional: She is oriented to person, place, and time and well-developed, well-nourished, and in no distress.  HENT:  Head: Normocephalic and atraumatic.  Eyes: Conjunctivae and EOM are normal. Pupils are equal, round, and  reactive to light.  Neck: Normal range of motion. Neck supple. No tracheal deviation present. No thyromegaly present.  Cardiovascular: Normal rate, regular rhythm and normal heart sounds.   Pulmonary/Chest: Mild respiratory distress bilateral crackles throughout both lungs  Abdominal: Soft. Bowel sounds are normal. She exhibits no distension. Non tender Musculoskeletal: Normal range of motion.  Neurological: She is alert and oriented to person, place, and time. No cranial nerve deficit.  Skin: Skin is warm and dry. No rash noted.  Psychiatric: Mood and affect normal.   LABORATORY PANEL:   CBC  Recent Labs Lab 04/01/15 0433  WBC 15.2*  HGB 12.6  HCT 37.8  PLT 359   ------------------------------------------------------------------------------------------------------------------ Chemistries   Recent Labs Lab 03/28/15 1247  04/01/15 0433  NA 136  < > 136  K 4.2  < > 2.4*  CL 101  < > 86*  CO2 27  < > 36*  GLUCOSE 143*  < > 155*  BUN <5*  < > 12  CREATININE 0.47  < > 0.50  CALCIUM 8.5*  < > 8.6*  AST 23  --   --   ALT 17  --   --   ALKPHOS 64  --   --   BILITOT 0.7  --   --   < > = values in this interval not displayed. RADIOLOGY:  Dg Chest 2 View  04/01/2015   CLINICAL DATA:  Increased shortness of Breath, history of pancreatitis  EXAM: CHEST - 2 VIEW  COMPARISON:  03/29/2015  FINDINGS: Cardiac shadow remains enlarged. The previously seen changes in the right lung and majority of the left lung have improved significantly in the interval. Some mild residual patchy opacities are seen. No sizable effusion is noted. No  bony abnormality is seen. Air is noted in the retrosternal region consistent with a prior surgical history.  IMPRESSION: Significant improved aeration bilaterally with mild patchy changes remaining.   Electronically Signed   By: Alcide Clever M.D.   On: 04/01/2015 07:46   ASSESSMENT AND PLAN:  39 year old Caucasian female presenting with abdominal pain.  1.  Acute pancreatitis: History of cholecystectomy, does not drink alcohol,  MRCP no significant pathology , suspected use due to NSAID , also GI feels that may have passed a stone  2.  Leukocytosis: Likely from pancreatitis as well possible pneumonia, by mouth Levaquin  3.  Hypertension/tachycardia: Likely from inadequate pain control, blood pressure improved  4. Acute respiratory failure  Due to acute chf likely diastolic echo with a normal EF by mouth Lasix  5. Severe hypokalemia: Oral replacement due to patient unable tolerate IV   Venous thromboembolism prophylactic: Heparin subcutaneous   All the records are reviewed and case discussed with Care Management/Social Worker and GI Dr. Mechele Collin. Management plans discussed with the patient and she is in agreement.  CODE STATUS: Full Code  TOTAL TIME TAKING CARE OF THIS PATIENT: .        Auburn Bilberry M.D on 04/01/2015 at 2:03 PM  Between 7am to 6pm - Pager - [phone]  After 6pm go to [email or web address] - password [email or web address] [facility]  [place] [facility]  Office  [phone]  CC:  Primary care physician; No PCP Per Patient

## 2015-04-02 ENCOUNTER — Encounter: Payer: Self-pay | Admitting: Radiology

## 2015-04-02 ENCOUNTER — Inpatient Hospital Stay: Payer: 59

## 2015-04-02 LAB — CBC
HEMATOCRIT: 39.4 % (ref 35.0–47.0)
Hemoglobin: 13.2 g/dL (ref 12.0–16.0)
MCH: 28.9 pg (ref 26.0–34.0)
MCHC: 33.5 g/dL (ref 32.0–36.0)
MCV: 86.3 fL (ref 80.0–100.0)
Platelets: 373 10*3/uL (ref 150–440)
RBC: 4.57 MIL/uL (ref 3.80–5.20)
RDW: 13.3 % (ref 11.5–14.5)
WBC: 18.2 10*3/uL — AB (ref 3.6–11.0)

## 2015-04-02 MED ORDER — URSODIOL 300 MG PO CAPS
300.0000 mg | ORAL_CAPSULE | Freq: Two times a day (BID) | ORAL | Status: DC
  Administered 2015-04-02 – 2015-04-04 (×4): 300 mg via ORAL
  Filled 2015-04-02 (×5): qty 1

## 2015-04-02 MED ORDER — HYDROMORPHONE HCL 1 MG/ML IJ SOLN
1.0000 mg | INTRAMUSCULAR | Status: DC | PRN
  Administered 2015-04-02 – 2015-04-03 (×6): 1 mg via INTRAVENOUS
  Filled 2015-04-02 (×6): qty 1

## 2015-04-02 MED ORDER — LEVOFLOXACIN 500 MG PO TABS
750.0000 mg | ORAL_TABLET | Freq: Every day | ORAL | Status: DC
  Administered 2015-04-03 – 2015-04-04 (×2): 750 mg via ORAL
  Filled 2015-04-02: qty 2
  Filled 2015-04-02 (×3): qty 1

## 2015-04-02 MED ORDER — IOHEXOL 300 MG/ML  SOLN
100.0000 mL | Freq: Once | INTRAMUSCULAR | Status: AC | PRN
  Administered 2015-04-02: 100 mL via INTRAVENOUS

## 2015-04-02 MED ORDER — LEVOFLOXACIN 250 MG PO TABS
250.0000 mg | ORAL_TABLET | Freq: Once | ORAL | Status: AC
Start: 2015-04-02 — End: 2015-04-02
  Administered 2015-04-02: 250 mg via ORAL
  Filled 2015-04-02: qty 1

## 2015-04-02 MED ORDER — POTASSIUM CHLORIDE CRYS ER 20 MEQ PO TBCR
20.0000 meq | EXTENDED_RELEASE_TABLET | Freq: Every day | ORAL | Status: DC
  Administered 2015-04-02 – 2015-04-03 (×2): 20 meq via ORAL
  Filled 2015-04-02 (×2): qty 1

## 2015-04-02 NOTE — Progress Notes (Signed)
Ct with still moderate amount of inflammation in pancrase change to npo supprotive care
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
## 2015-04-02 NOTE — Consult Note (Signed)
Will start patient on Actigal which may prevent further gallstone formation as I think this was likely gall stone pancreatitis.  CT today noted

## 2015-04-02 NOTE — Progress Notes (Signed)
[facility] Physicians - [place] at [facility]   PATIENT NAME: Helen Burgess    MR#:  [id number]  DATE OF BIRTH:  1976/01/02  SUBJECTIVE:  CHIEF COMPLAINT:   Chief Complaint  Patient presents with  . Abdominal Pain   she continues to complain of abdominal discomfort and nausea, or WBC count is more elevated today   REVIEW OF SYSTEMS:  Review of Systems  Constitutional: Positive fever, chills, weight loss, malaise/fatigue and diaphoresis.  HENT: Negative for ear discharge, ear pain, hearing loss, nosebleeds, sore throat and tinnitus.   Eyes: Negative for blurred vision and pain.  Respiratory: Negative for cough, hemoptysis, resolved shortness of breath no wheezing.   Cardiovascular: Negative for chest pain, palpitations, orthopnea, leg swelling and PND.  Gastrointestinal: Positive nausea and no abominal pain Negative for heartburn, vomiting, diarrhea, constipation and blood in stool.  Genitourinary: Negative for dysuria, urgency and frequency.  Musculoskeletal: Negative for myalgias and back pain.  Skin: Negative for itching and rash.  Neurological: Negative for dizziness, tingling, tremors, speech change, focal weakness, seizures, weakness and headaches.  Endo/Heme/Allergies: Does not bruise/bleed easily.  Psychiatric/Behavioral: Negative for depression. The patient is not nervous/anxious.    DRUG ALLERGIES:   Allergies  Allergen Reactions  . Ultram [Tramadol] Itching   VITALS:  Blood pressure 114/65, pulse 90, temperature 97.9 F (36.6 C), temperature source Oral, resp. rate 16, height 5\' 7"  (1.702 m), weight 77.111 kg (170 lb), last menstrual period 03/31/2015, SpO2 95 %. PHYSICAL EXAMINATION:  Physical Exam  Constitutional: She is oriented to person, place, and time and well-developed, well-nourished, and in no distress.  HENT:  Head: Normocephalic and atraumatic.  Eyes: Conjunctivae and EOM are normal. Pupils are equal, round, and reactive to light.   Neck: Normal range of motion. Neck supple. No tracheal deviation present. No thyromegaly present.  Cardiovascular: Normal rate, regular rhythm and normal heart sounds.   Pulmonary/Chest: Mild respiratory distress bilateral crackles throughout both lungs  Abdominal: Soft. Bowel sounds are normal. She exhibits no distension. Non tender Musculoskeletal: Normal range of motion.  Neurological: She is alert and oriented to person, place, and time. No cranial nerve deficit.  Skin: Skin is warm and dry. No rash noted.  Psychiatric: Mood and affect normal.   LABORATORY PANEL:   CBC  Recent Labs Lab 04/02/15 0643  WBC 18.2*  HGB 13.2  HCT 39.4  PLT 373   ------------------------------------------------------------------------------------------------------------------ Chemistries   Recent Labs Lab 03/28/15 1247  04/01/15 1822  NA 136  < > 135  K 4.2  < > 3.5  CL 101  < > 92*  CO2 27  < > 31  GLUCOSE 143*  < > 207*  BUN <5*  < > 15  CREATININE 0.47  < > 0.61  CALCIUM 8.5*  < > 9.1  AST 23  --   --   ALT 17  --   --   ALKPHOS 64  --   --   BILITOT 0.7  --   --   < > = values in this interval not displayed. RADIOLOGY:  No results found. ASSESSMENT AND PLAN:  39 year old Caucasian female presenting with abdominal pain.  1. Acute pancreatitis: History of cholecystectomy, does not drink alcohol,  MRCP no significant pathology , suspected use due to NSAID , also GI feels that may have passed a stone, patient's WBC count elevated still has nausea although lipase is negative am concerned about persistent pancreatitis several get a CT scan of the abdomen again  2.  Leukocytosis: Likely from pancreatitis as well possible pneumonia, by mouth Levaquin  3.  Hypertension/tachycardia: Likely from inadequate pain control, blood pressure improved  4. Acute respiratory failure  Due to acute chf likely diastolic echo with a normal EF by mouth Lasix  5. Severe hypokalemia: Improved with oral  replacement  Venous thromboembolism prophylactic: Heparin subcutaneous   All the records are reviewed and case discussed with Care Management/Social Worker and GI Dr. Mechele Collin. Management plans discussed with the patient and she is in agreement.  CODE STATUS: Full Code  TOTAL TIME TAKING CARE OF THIS PATIENT: .        Auburn Bilberry M.D on 04/02/2015 at 1:55 PM  Between 7am to 6pm - Pager - [phone]  After 6pm go to [email or web address] - password [email or web address] [facility]  [place] [facility]  Office  [phone]  CC:  Primary care physician; No PCP Per Patient
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
## 2015-04-03 LAB — COMPREHENSIVE METABOLIC PANEL
ALT: 17 U/L (ref 14–54)
AST: 17 U/L (ref 15–41)
Albumin: 3 g/dL — ABNORMAL LOW (ref 3.5–5.0)
Alkaline Phosphatase: 81 U/L (ref 38–126)
Anion gap: 11 (ref 5–15)
BUN: 11 mg/dL (ref 6–20)
CHLORIDE: 98 mmol/L — AB (ref 101–111)
CO2: 23 mmol/L (ref 22–32)
Calcium: 9.3 mg/dL (ref 8.9–10.3)
Creatinine, Ser: 0.52 mg/dL (ref 0.44–1.00)
GFR calc Af Amer: >60 mL/min (ref >60)
GLUCOSE: 156 mg/dL — AB (ref 65–99)
POTASSIUM: 4.5 mmol/L (ref 3.5–5.1)
Sodium: 132 mmol/L — ABNORMAL LOW (ref 135–145)
Total Bilirubin: 0.7 mg/dL (ref 0.3–1.2)
Total Protein: 7.4 g/dL (ref 6.5–8.1)

## 2015-04-03 LAB — CBC
HCT: 42 % (ref 35.0–47.0)
Hemoglobin: 14.2 g/dL (ref 12.0–16.0)
MCH: 29.1 pg (ref 26.0–34.0)
MCHC: 33.9 g/dL (ref 32.0–36.0)
MCV: 86 fL (ref 80.0–100.0)
PLATELETS: 432 10*3/uL (ref 150–440)
RBC: 4.89 MIL/uL (ref 3.80–5.20)
RDW: 13.2 % (ref 11.5–14.5)
WBC: 21.5 10*3/uL — AB (ref 3.6–11.0)

## 2015-04-03 LAB — LIPASE, BLOOD: LIPASE: 35 U/L (ref 22–51)

## 2015-04-03 MED ORDER — BISACODYL 10 MG RE SUPP
10.0000 mg | Freq: Once | RECTAL | Status: AC
  Administered 2015-04-03: 10 mg via RECTAL
  Filled 2015-04-03: qty 1

## 2015-04-03 NOTE — Progress Notes (Signed)
Pts. Still can't have a BM after receiving stool softners and laxitive. Suppository ordered per Dr. Imogene Burn.

## 2015-04-03 NOTE — Progress Notes (Signed)
[facility] - [place] at [facility]   PATIENT NAME: Helen Burgess    MR#:  [id number]  DATE OF BIRTH:  [birth year]  SUBJECTIVE:  CHIEF COMPLAINT:   Chief Complaint  Patient presents with  . Abdominal Pain   no fever, chills, abdominal pain, nausea or vomiting but is still has leukocytosis. REVIEW OF SYSTEMS:  Review of Systems  Constitutional: No fever, chills, weight loss, malaise/fatigue and diaphoresis.  HENT: Negative for ear discharge, ear pain, hearing loss, nosebleeds, sore throat and tinnitus.   Eyes: Negative for blurred vision and pain.  Respiratory: Negative for cough, hemoptysis, resolved shortness of breath no wheezing.   Cardiovascular: Negative for chest pain, palpitations, orthopnea, leg swelling and PND.  Gastrointestinal: No nausea and no abominal pain Negative for heartburn, vomiting, diarrhea, constipation and blood in stool.  Genitourinary: Negative for dysuria, urgency and frequency.  Musculoskeletal: Negative for myalgias and back pain.  Skin: Negative for itching and rash.  Neurological: Negative for dizziness, tingling, tremors, speech change, focal weakness, seizures, weakness and headaches.  Endo/Heme/Allergies: Does not bruise/bleed easily.  Psychiatric/Behavioral: Negative for depression. The patient is not nervous/anxious.    DRUG ALLERGIES:   Allergies  Allergen Reactions  . Ultram [Tramadol] Itching   VITALS:  Blood pressure 114/68, pulse 83, temperature 98.2 F (36.8 C), temperature source Oral, resp. rate 16, height  (1.702 m), weight 77.111 kg (170 lb), last menstrual period 03/31/2015, SpO2 94 %. PHYSICAL EXAMINATION:  Physical Exam  Constitutional: She is oriented to person, place, and time and well-developed, well-nourished, and in no distress.  HENT:  Head: Normocephalic and atraumatic.  Eyes: Conjunctivae and EOM are normal. Pupils are equal, round, and reactive to light.  Neck: Normal range of motion.  Neck supple. No tracheal deviation present. No thyromegaly present.  Cardiovascular: Normal rate, regular rhythm and normal heart sounds.   Pulmonary/Chest: Bilateral air entry, no wheezing or rales, no use of accessory muscles to breathe. Abdominal: Soft. Bowel sounds are normal. She exhibits no distension. Non tenderness. Musculoskeletal: Normal range of motion.  Neurological: She is alert and oriented to person, place, and time. No cranial nerve deficit.  Skin: Skin is warm and dry. No rash noted.  Psychiatric: Mood and affect normal.   LABORATORY PANEL:   CBC  Recent Labs Lab 04/03/15 0701  WBC 21.5*  HGB 14.2  HCT 42.0  PLT 432   ------------------------------------------------------------------------------------------------------------------ Chemistries   Recent Labs Lab 04/03/15 0701  NA 132*  K 4.5  CL 98*  CO2 23  GLUCOSE 156*  BUN 11  CREATININE 0.52  CALCIUM 9.3  AST 17  ALT 17  ALKPHOS 81  BILITOT 0.7   RADIOLOGY:  No results found. ASSESSMENT AND PLAN:  39 year old Caucasian female presenting with abdominal pain.  1. Acute pancreatitis: History of cholecystectomy, does not drink alcohol,  MRCP no significant pathology , suspected use due to NSAID , also GI feels that may have passed a stone, patient's WBC count elevated but lipase is negative. persistent pancreatitis per CT scan of the abdomen again. on Actigal which may prevent further gallstone formation per Dr. Markham Jordan. Start clear liquid diet and follow-up CBC in the a.m.  2.  Leukocytosis: Likely from pancreatitis as well possible pneumonia, by mouth Levaquin  3.  Hypertension/tachycardia: Likely from inadequate pain control, blood pressure improved  4. Acute respiratory failure  Due to acute chf likely diastolic echo with a normal EF by mouth Lasix  5. Severe hypokalemia: Improved with oral replacement  Venous thromboembolism prophylactic: Heparin subcutaneous   All the records are  reviewed and case discussed with Care Management/Social Worker. Management plans discussed with the patient and she is in agreement.  CODE STATUS: Full Code  TOTAL TIME TAKING CARE OF THIS PATIENT: 39 minutes.        Shaune Pollack M.D on 04/03/2015 at 3:35 PM  Between 7am to 6pm - Pager - [phone]  After 6pm go to [email or web address] - password [email or web address] [facility]  [place] [facility]  Office  [phone]  CC:  Primary care physician; No PCP Per Patient

## 2015-04-04 LAB — CULTURE, BLOOD (ROUTINE X 2)
CULTURE: NO GROWTH
Culture: NO GROWTH

## 2015-04-04 LAB — CBC
HEMATOCRIT: 41.4 % (ref 35.0–47.0)
HEMOGLOBIN: 14 g/dL (ref 12.0–16.0)
MCH: 28.9 pg (ref 26.0–34.0)
MCHC: 33.7 g/dL (ref 32.0–36.0)
MCV: 85.6 fL (ref 80.0–100.0)
Platelets: 415 10*3/uL (ref 150–440)
RBC: 4.84 MIL/uL (ref 3.80–5.20)
RDW: 13.2 % (ref 11.5–14.5)
WBC: 17.9 10*3/uL — ABNORMAL HIGH (ref 3.6–11.0)

## 2015-04-04 LAB — MAGNESIUM: Magnesium: 1.9 mg/dL (ref 1.7–2.4)

## 2015-04-04 MED ORDER — URSODIOL 300 MG PO CAPS: 300.0000 mg | ORAL_CAPSULE | Freq: Two times a day (BID) | ORAL | Status: DC

## 2015-04-04 MED ORDER — LEVOFLOXACIN 750 MG PO TABS: 750.0000 mg | ORAL_TABLET | Freq: Every day | ORAL | Status: DC

## 2015-04-04 NOTE — Discharge Instructions (Signed)
Low sodium and low fat diet. °Activity as tolerated. °

## 2015-04-04 NOTE — Discharge Summary (Signed)
[facility] - [place] at [facility]   PATIENT NAME: Helen Burgess    MR#:  [id number]  DATE OF BIRTH:  [birth year]  DATE OF ADMISSION:  03/27/2015 ADMITTING PHYSICIAN: Wyatt Haste, MD  DATE OF DISCHARGE: 04/04/2015  1:14 PM  PRIMARY CARE PHYSICIAN: No PCP Per Patient    ADMISSION DIAGNOSIS:  Generalized abdominal pain [R10.84] Acute pancreatitis, unspecified pancreatitis type [K85.9] Non-intractable vomiting with nausea, vomiting of unspecified type [R11.2]   DISCHARGE DIAGNOSIS:   Acute pancreatitis with leukocytosis SECONDARY DIAGNOSIS:   Past Medical History  Diagnosis Date  . ASD (atrial septal defect)   . Hx SBO     HOSPITAL COURSE:   1. Acute pancreatitis: History of cholecystectomy, does not drink alcohol, MRCP showed no significant pathology , suspected use due to NSAID , also GI feels that may have passed a stone, patient's WBC count elevated but lipase is negative. persistent pancreatitis per CT scan of the abdomen again. She was started with Actigal which may prevent further gallstone formation per Dr. Markham Jordan. The patient tolerated advanced diet with no complaints.  2. Leukocytosis: Likely from pancreatitis as well possible pneumonia, the patient was started with Levaquin. Leukocytosis is improving. She needs to continue Levaquin and follow-up CBC with PCP.  3. Hypertension/tachycardia: Likely from inadequate pain control, blood pressure improved  4. Acute respiratory failure Due to fluid overload. Echo shows normal EF. She was treated with by mouth Lasix. Her symptoms improved and lungs sound clear.  5. Severe hypokalemia: Improved with oral replacement.  DISCHARGE CONDITIONS:   The patient is clinically stable and discharged to home today.  CONSULTS OBTAINED:  Treatment Team:  Scot Jun, MD Shaune Pollack, MD  DRUG ALLERGIES:   Allergies  Allergen Reactions  . Ultram [Tramadol] Itching    DISCHARGE MEDICATIONS:    Discharge Medication List as of 04/04/2015 12:56 PM    START taking these medications   Details  levofloxacin (LEVAQUIN) 750 MG tablet Take 1 tablet (750 mg total) by mouth daily., Starting 04/04/2015, Until Discontinued, Print    ursodiol (ACTIGALL) 300 MG capsule Take 1 capsule (300 mg total) by mouth 2 (two) times daily., Starting 04/04/2015, Until Discontinued, Print         DISCHARGE INSTRUCTIONS:    If you experience worsening of your admission symptoms, develop shortness of breath, life threatening emergency, suicidal or homicidal thoughts you must seek medical attention immediately by calling 911 or calling your MD immediately  if symptoms less severe.  You Must read complete instructions/literature along with all the possible adverse reactions/side effects for all the Medicines you take and that have been prescribed to you. Take any new Medicines after you have completely understood and accept all the possible adverse reactions/side effects.   Please note  You were cared for by a hospitalist during your hospital stay. If you have any questions about your discharge medications or the care you received while you were in the hospital after you are discharged, you can call the unit and asked to speak with the hospitalist on call if the hospitalist that took care of you is not available. Once you are discharged, your primary care physician will handle any further medical issues. Please note that NO REFILLS for any discharge medications will be authorized once you are discharged, as it is imperative that you return to your primary care physician (or establish a relationship with a primary care physician if you do not have one) for your aftercare needs so  that they can reassess your need for medications and monitor your lab values.    Today   SUBJECTIVE   No complaint, tolerating diet.   VITAL SIGNS:  Blood pressure 129/83, pulse 84, temperature 97.8 F (36.6 C), temperature  source Oral, resp. rate 18, height 5\' 7"  (1.702 m), weight 77.111 kg (170 lb), last menstrual period 03/31/2015, SpO2 96 %.  I/O:   Intake/Output Summary (Last 24 hours) at 04/04/15 1334 Last data filed at 04/04/15 0800  Gross per 24 hour  Intake   1050 ml  Output      2 ml  Net   1048 ml    PHYSICAL EXAMINATION:  GENERAL:  39 y.o.-year-old patient lying in the bed with no acute distress.  EYES: Pupils equal, round, reactive to light and accommodation. No scleral icterus. Extraocular muscles intact.  HEENT: Head atraumatic, normocephalic. Oropharynx and nasopharynx clear.  NECK:  Supple, no jugular venous distention. No thyroid enlargement, no tenderness.  LUNGS: Normal breath sounds bilaterally, no wheezing, rales,rhonchi or crepitation. No use of accessory muscles of respiration.  CARDIOVASCULAR: S1, S2 normal. No murmurs, rubs, or gallops.  ABDOMEN: Soft, non-tender, non-distended. Bowel sounds present. No organomegaly or mass.  EXTREMITIES: No pedal edema, cyanosis, or clubbing.  NEUROLOGIC: Cranial nerves II through XII are intact. Muscle strength 5/5 in all extremities. Sensation intact. Gait not checked.  PSYCHIATRIC: The patient is alert and oriented x 3.  SKIN: No obvious rash, lesion, or ulcer.   DATA REVIEW:   CBC  Recent Labs Lab 04/04/15 0511  WBC 17.9*  HGB 14.0  HCT 41.4  PLT 415    Chemistries   Recent Labs Lab 04/03/15 0701 04/04/15 0511  NA 132*  --   K 4.5  --   CL 98*  --   CO2 23  --   GLUCOSE 156*  --   BUN 11  --   CREATININE 0.52  --   CALCIUM 9.3  --   MG  --  1.9  AST 17  --   ALT 17  --   ALKPHOS 81  --   BILITOT 0.7  --     Cardiac Enzymes No results for input(s): TROPONINI in the last 168 hours.  Microbiology Results  Results for orders placed or performed during the hospital encounter of 03/27/15  Culture, blood (routine x 2)     Status: None   Collection Time: 03/30/15 10:12 AM  Result Value Ref Range Status   Specimen  Description BLOOD LEFT ARM  Final   Special Requests   Final    BOTTLES DRAWN AEROBIC AND ANAEROBIC  AER 7CC ANA 5CC   Culture NO GROWTH 5 DAYS  Final   Report Status 04/04/2015 FINAL  Final  Culture, blood (routine x 2)     Status: None   Collection Time: 03/30/15 10:27 AM  Result Value Ref Range Status   Specimen Description BLOOD LEFT HAND  Final   Special Requests BOTTLES DRAWN AEROBIC AND ANAEROBIC  2CC  Final   Culture NO GROWTH 5 DAYS  Final   Report Status 04/04/2015 FINAL  Final    RADIOLOGY:  No results found.      Management plans discussed with the patient, family and they are in agreement.  CODE STATUS:     Code Status Orders        Start     Ordered   03/28/15 0202  Full code   Continuous     03/28/15 0201  TOTAL TIME TAKING CARE OF THIS PATIENT: 33 minutes.    Shaune Pollack M.D on 04/04/2015 at 1:34 PM  Between 7am to 6pm - Pager - [phone]  After 6pm go to [email or web address] - password [email or web address] [facility]  [place] [facility]  Office  [phone]  CC: Primary care physician; No PCP Per Patient

## 2015-04-07 ENCOUNTER — Encounter: Payer: Self-pay | Admitting: Emergency Medicine

## 2015-04-07 ENCOUNTER — Ambulatory Visit
Admission: EM | Admit: 2015-04-07 | Discharge: 2015-04-07 | Disposition: A | Discharge status: Home / Self Care | Payer: 59 | Attending: Family Medicine | Admitting: Family Medicine

## 2015-04-07 ENCOUNTER — Ambulatory Visit: Payer: 59

## 2015-04-07 DIAGNOSIS — J189 Pneumonia, unspecified organism: Secondary | ICD-10-CM

## 2015-04-07 DIAGNOSIS — S29012A Strain of muscle and tendon of back wall of thorax, initial encounter: Secondary | ICD-10-CM | POA: Diagnosis not present

## 2015-04-07 DIAGNOSIS — K859 Acute pancreatitis, unspecified: Secondary | ICD-10-CM | POA: Diagnosis not present

## 2015-04-07 DIAGNOSIS — K858 Other acute pancreatitis without necrosis or infection: Secondary | ICD-10-CM

## 2015-04-07 LAB — CBC WITH DIFFERENTIAL/PLATELET
Basophils Absolute: 0.1 10*3/uL (ref 0–0.1)
Basophils Relative: 1 %
EOS PCT: 1 %
Eosinophils Absolute: 0.1 10*3/uL (ref 0–0.7)
HEMATOCRIT: 42.6 % (ref 35.0–47.0)
HEMOGLOBIN: 14.4 g/dL (ref 12.0–16.0)
LYMPHS ABS: 4.1 10*3/uL — AB (ref 1.0–3.6)
Lymphocytes Relative: 29 %
MCH: 28.9 pg (ref 26.0–34.0)
MCHC: 33.8 g/dL (ref 32.0–36.0)
MCV: 85.3 fL (ref 80.0–100.0)
MONOS PCT: 6 %
Monocytes Absolute: 0.8 10*3/uL (ref 0.2–0.9)
NEUTROS PCT: 63 %
Neutro Abs: 9 10*3/uL — ABNORMAL HIGH (ref 1.4–6.5)
Platelets: 596 10*3/uL — ABNORMAL HIGH (ref 150–440)
RBC: 5 MIL/uL (ref 3.80–5.20)
RDW: 13.4 % (ref 11.5–14.5)
WBC: 14.1 10*3/uL — AB (ref 3.6–11.0)

## 2015-04-07 LAB — COMPREHENSIVE METABOLIC PANEL
ALK PHOS: 74 U/L (ref 38–126)
ALT: 16 U/L (ref 14–54)
AST: 23 U/L (ref 15–41)
Albumin: 3.8 g/dL (ref 3.5–5.0)
Anion gap: 12 (ref 5–15)
BILIRUBIN TOTAL: 0.4 mg/dL (ref 0.3–1.2)
BUN: 13 mg/dL (ref 6–20)
CALCIUM: 9.3 mg/dL (ref 8.9–10.3)
CO2: 22 mmol/L (ref 22–32)
CREATININE: 0.78 mg/dL (ref 0.44–1.00)
Chloride: 98 mmol/L — ABNORMAL LOW (ref 101–111)
Glucose, Bld: 190 mg/dL — ABNORMAL HIGH (ref 65–99)
Potassium: 3.7 mmol/L (ref 3.5–5.1)
Sodium: 132 mmol/L — ABNORMAL LOW (ref 135–145)
Total Protein: 7.8 g/dL (ref 6.5–8.1)

## 2015-04-07 LAB — LIPASE, BLOOD: LIPASE: 68 U/L — AB (ref 22–51)

## 2015-04-07 MED ORDER — HYDROCODONE-ACETAMINOPHEN 5-325 MG PO TABS: 1.0000 | ORAL_TABLET | Freq: Four times a day (QID) | ORAL | Status: DC | PRN

## 2015-04-07 NOTE — ED Notes (Signed)
Patient states she got out of [facility] on Friday. Patient was having upper abdominal and upper back pain while in the hospital and it is worse now.

## 2015-04-07 NOTE — ED Provider Notes (Signed)
CSN: [id number]     Arrival date & time 04/07/15  1827 History   First MD Initiated Contact with Patient 04/07/15 1853     Chief Complaint  Patient presents with  . Abdominal Pain   (Consider location/radiation/quality/duration/timing/severity/associated sxs/prior Treatment) HPI Comments: 39 yo female with a recent hospitalization for acute pancreatitis (unclear etiology?) and pneumonia. Was discharged 3 days ago from the hospital and now presents with bilateral upper back pain since yesterday and abdominal pain that hasn't resolved completely since hospitalization. States abdominal pain is not worse, but has not resolved. Denies any fevers, chills, vomiting, diarrhea, shortness of breath. Has had normal bowel movements. On levaquin for pneumonia. Also states has been taking ibuprofen since yesterday for her back pain.   Patient is a 39 y.o. female presenting with abdominal pain. The history is provided by the patient.  Abdominal Pain   Past Medical History  Diagnosis Date  . ASD (atrial septal defect)   . Hx SBO    Past Surgical History  Procedure Laterality Date  . Cholecystectomy    . 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 Diabetes Neg Hx    Social History  Substance Use Topics  . Smoking status: Never Smoker   . Smokeless tobacco: None  . Alcohol Use: No   OB History    No data available     Review of Systems  Gastrointestinal: Positive for abdominal pain.    Allergies  Ultram  Home Medications   Prior to Admission medications   Medication Sig Start Date End Date Taking? Authorizing Provider  HYDROcodone-acetaminophen (NORCO/VICODIN) 5-325 MG per tablet Take 1-2 tablets by mouth every 6 (six) hours as needed. 04/07/15   [name], MD  levofloxacin (LEVAQUIN) 750 MG tablet Take 1 tablet (750 mg total) by mouth daily. 04/04/15   Shaune Pollack, MD  ursodiol (ACTIGALL) 300 MG  capsule Take 1 capsule (300 mg total) by mouth 2 (two) times daily. 04/04/15   Shaune Pollack, MD   BP 128/88 mmHg  Pulse 110  Temp(Src) 97.7 F (36.5 C) (Tympanic)  Resp 18  Ht 5\' 7"  (1.702 m)  Wt 170 lb (77.111 kg)  BMI 26.62 kg/m2  SpO2 98%  LMP 03/29/2015 Physical Exam  Constitutional: She is oriented to person, place, and time. She appears well-developed and well-nourished. No distress.  Eyes: Conjunctivae are normal.  Neck: Neck supple.  Cardiovascular: Regular rhythm, normal heart sounds and intact distal pulses.  Tachycardia present.   Pulmonary/Chest: Effort normal and breath sounds normal. No respiratory distress. She has no wheezes. She has no rales.  Abdominal: Soft. Bowel sounds are normal. She exhibits no distension and no mass. There is no tenderness. There is no rebound and no guarding.  Musculoskeletal: She exhibits no edema or tenderness.  Tenderness to palpation over the trapezius muscles bilaterally and thoracic paraspinous muscles bilaterally  Neurological: She is alert and oriented to person, place, and time.  Skin: No rash noted. She is not diaphoretic.  Nursing note and vitals reviewed.   ED Course  Procedures (including critical care time) Labs Review Labs Reviewed  CBC WITH DIFFERENTIAL/PLATELET - Abnormal; Notable for the following:    WBC 14.1 (*)    Platelets 596 (*)    Neutro Abs 9.0 (*)    Lymphs Abs 4.1 (*)    All other components within normal limits  COMPREHENSIVE METABOLIC  PANEL - Abnormal; Notable for the following:    Sodium 132 (*)    Chloride 98 (*)    Glucose, Bld 190 (*)    All other components within normal limits  LIPASE, BLOOD - Abnormal; Notable for the following:    Lipase 68 (*)    All other components within normal limits    Imaging Review Dg Chest 2 View  04/07/2015   CLINICAL DATA:  Acute pancreatitis last week complicated by pneumonia, today with mid thoracic pain across back getting worse, shortness of breath with exertion,  nonproductive cough  EXAM: CHEST  2 VIEW  COMPARISON:  04/01/2015  FINDINGS: Enlargement of cardiac silhouette.  Mediastinal contours and pulmonary vascularity normal.  Mild persistent infiltrate at RIGHT lung base.  Remaining lungs clear.  Improved aeration in LEFT lung since previous exam.  No pleural effusion or pneumothorax.  IMPRESSION: Improved LEFT lung infiltrates since previous exam with mild persistent RIGHT basilar infiltrate.   Electronically Signed   By: Ulyses Southward M.D.   On: 04/07/2015 19:39     MDM   1. Upper back strain, initial encounter   2. Other pancreatitis   3. Community acquired pneumonia    Discharge Medication List as of 04/07/2015  7:55 PM    START taking these medications   Details  HYDROcodone-acetaminophen (NORCO/VICODIN) 5-325 MG per tablet Take 1-2 tablets by mouth every 6 (six) hours as needed., Starting 04/07/2015, Until Discontinued, Print      Plan: 1. Test/x-ray results and diagnoses reviewed with patient (pancreatitis stable; pneumonia improving) 2. rx as per orders; risks, benefits, potential side effects reviewed with patient 3. Recommend supportive treatment with heat to upper back area, stretching exercises 4. Recommend establish care with a PCP and follow up with GI as scheduled 5. F/u prn if symptoms worsen or don't improve    [name], MD 04/07/15 2015

## 2015-04-20 ENCOUNTER — Emergency Department
Admission: EM | Admit: 2015-04-20 | Discharge: 2015-04-20 | Disposition: A | Discharge status: Home / Self Care | Payer: 59 | Attending: Emergency Medicine | Admitting: Emergency Medicine

## 2015-04-20 ENCOUNTER — Encounter: Payer: Self-pay | Admitting: Medical Oncology

## 2015-04-20 DIAGNOSIS — Y929 Unspecified place or not applicable: Secondary | ICD-10-CM | POA: Diagnosis not present

## 2015-04-20 DIAGNOSIS — Z79899 Other long term (current) drug therapy: Secondary | ICD-10-CM | POA: Insufficient documentation

## 2015-04-20 DIAGNOSIS — Y939 Activity, unspecified: Secondary | ICD-10-CM | POA: Insufficient documentation

## 2015-04-20 DIAGNOSIS — X58XXXA Exposure to other specified factors, initial encounter: Secondary | ICD-10-CM | POA: Insufficient documentation

## 2015-04-20 DIAGNOSIS — S46912A Strain of unspecified muscle, fascia and tendon at shoulder and upper arm level, left arm, initial encounter: Secondary | ICD-10-CM | POA: Diagnosis not present

## 2015-04-20 DIAGNOSIS — Z792 Long term (current) use of antibiotics: Secondary | ICD-10-CM | POA: Diagnosis not present

## 2015-04-20 DIAGNOSIS — S161XXA Strain of muscle, fascia and tendon at neck level, initial encounter: Secondary | ICD-10-CM | POA: Insufficient documentation

## 2015-04-20 DIAGNOSIS — Y999 Unspecified external cause status: Secondary | ICD-10-CM | POA: Diagnosis not present

## 2015-04-20 DIAGNOSIS — S46812A Strain of other muscles, fascia and tendons at shoulder and upper arm level, left arm, initial encounter: Secondary | ICD-10-CM

## 2015-04-20 DIAGNOSIS — S4992XA Unspecified injury of left shoulder and upper arm, initial encounter: Secondary | ICD-10-CM | POA: Diagnosis present

## 2015-04-20 MED ORDER — OXYCODONE-ACETAMINOPHEN 5-325 MG PO TABS: 1.0000 | ORAL_TABLET | ORAL | Status: DC | PRN

## 2015-04-20 MED ORDER — DICLOFENAC SODIUM 75 MG PO TBEC: 75.0000 mg | DELAYED_RELEASE_TABLET | Freq: Two times a day (BID) | ORAL | Status: DC

## 2015-04-20 MED ORDER — BACLOFEN 10 MG PO TABS: 10.0000 mg | ORAL_TABLET | Freq: Three times a day (TID) | ORAL | Status: DC

## 2015-04-20 MED ORDER — KETOROLAC TROMETHAMINE 60 MG/2ML IM SOLN
60.0000 mg | Freq: Once | INTRAMUSCULAR | Status: AC
  Administered 2015-04-20: 60 mg via INTRAMUSCULAR
  Filled 2015-04-20: qty 2

## 2015-04-20 NOTE — ED Provider Notes (Signed)
[facility] Emergency Department Provider Note  ____________________________________________  Time seen: Approximately 11:59 AM  I have reviewed the triage vital signs and the nursing notes.   HISTORY  Chief Complaint Torticollis; Headache; and Shoulder Pain    HPI Helen Burgess is a 39 y.o. female presents with an ongoing history of neck and shoulder scapular pain. Has been seen by her PCP Dr. Sampson Goon. She was instructed to come over here for a shot for pain medication secondary to the muscle strain.Patient states that the pain is worse with movement or flexion of her head and neck.   Past Medical History  Diagnosis Date  . ASD (atrial septal defect)   . Hx SBO     Patient Active Problem List   Diagnosis Date Noted  . Acute pancreatitis 03/28/2015    Past Surgical History  Procedure Laterality Date  . Cholecystectomy    . 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 baclofen (LIORESAL) 10 MG tablet   Oral   Take 1 tablet (10 mg total) by mouth 3 (three) times daily.   30 tablet   0   . diclofenac (VOLTAREN) 75 MG EC tablet   Oral   Take 1 tablet (75 mg total) by mouth 2 (two) times daily.   60 tablet   0   . HYDROcodone-acetaminophen (NORCO/VICODIN) 5-325 MG per tablet   Oral   Take 1-2 tablets by mouth every 6 (six) hours as needed.   10 tablet   0   . levofloxacin (LEVAQUIN) 750 MG tablet   Oral   Take 1 tablet (750 mg total) by mouth daily.   5 tablet   0   . oxyCODONE-acetaminophen (ROXICET) 5-325 MG per tablet   Oral   Take 1-2 tablets by mouth every 4 (four) hours as needed for severe pain.   8 tablet   0   . ursodiol (ACTIGALL) 300 MG capsule   Oral   Take 1 capsule (300 mg total) by mouth 2 (two) times daily.   30 capsule   0     Allergies Ultram  Family History  Problem Relation Age of Onset  .  Diabetes Neg Hx     Social History Social History  Substance Use Topics  . Smoking status: Never Smoker   . Smokeless tobacco: None  . Alcohol Use: No    Review of Systems Constitutional: No fever/chills Eyes: No visual changes. ENT: No sore throat. Cardiovascular: Denies chest pain. Respiratory: Denies shortness of breath. Gastrointestinal: No abdominal pain.  No nausea, no vomiting.  No diarrhea.  No constipation. Genitourinary: Negative for dysuria. Musculoskeletal: Positive for scapular muscular trapezius type pain, neck pain. Skin: Negative for rash. Neurological: Negative for headaches, focal weakness or numbness.  10-point ROS otherwise negative.  ____________________________________________   PHYSICAL EXAM:  VITAL SIGNS: ED Triage Vitals  Enc Vitals Group     BP 04/20/15 1135 145/108 mmHg     Pulse Rate 04/20/15 1135 88     Resp 04/20/15 1135 18     Temp 04/20/15 1135 97.8 F (36.6 C)     Temp Source 04/20/15 1135 Oral     SpO2 04/20/15 1135 99 %     Weight 04/20/15 1135 170 lb (77.111 kg)     Height 04/20/15 1135  (1.702 m)  Head Cir --      Peak Flow --      Pain Score 04/20/15 1136 8     Pain Loc --      Pain Edu? --      Excl. in GC? --     Constitutional: Alert and oriented. Well appearing and in no acute distress. Eyes: Conjunctivae are normal. PERRL. EOMI. Head: Atraumatic. Nose: No congestion/rhinnorhea. Mouth/Throat: Mucous membranes are moist.  Oropharynx non-erythematous. Neck: No stridor.  No cervical or thoracic spinal tenderness. Cardiovascular: Normal rate, regular rhythm. Grossly normal heart sounds.  Good peripheral circulation. Respiratory: Normal respiratory effort.  No retractions. Lungs CTAB. Musculoskeletal: No lower extremity tenderness nor edema.  No joint effusions. Positive for musculoskeletal tenderness around the left scapula radiating up into the neck and trapezius muscle areas. Limited range of motion with the  abduction and lateralization of neck and head. Neurologic:  Normal speech and language. No gross focal neurologic deficits are appreciated. No gait instability. Skin:  Skin is warm, dry and intact. No rash noted. Psychiatric: Mood and affect are normal. Speech and behavior are normal.  ____________________________________________   LABS (all labs ordered are listed, but only abnormal results are displayed)  Labs Reviewed - No data to display ____________________________________________  PROCEDURES  Procedure(s) performed: None  Critical Care performed: No  ____________________________________________   INITIAL IMPRESSION / ASSESSMENT AND PLAN / ED COURSE  Pertinent labs & imaging results that were available during my care of the patient were reviewed by me and considered in my medical decision making (see chart for details).  Acute cervical trapezius muscle strains ongoing. Rx given for Robaxin 500 mg 4 times a day, Voltaren 75 mg twice a day. And a 2 day course of oxycodone to help with immediate pain. Patient recent pancreatitis she is follow-up with PCP or return to the ER with any worsening symptomology.  Patient voices no other emergency medical complaints at this visit. ____________________________________________   FINAL CLINICAL IMPRESSION(S) / ED DIAGNOSES  Final diagnoses:  Cervical muscle strain, initial encounter  Trapezius strain, left, initial encounter      Evangeline Dakin, PA-C 04/20/15 1228  Myrna Blazer, MD 04/20/15 [phone]

## 2015-04-20 NOTE — ED Notes (Addendum)
Pt was seen by Helen Burgess Friday for pancreatitis, pt was placed on meds. Pt also had c/o neck, left shoulder and head pain then, she was told that if the pain worsened to return to er. Pt states that the pancreatic pain has improved but her neck pain has not. Denies fever. Pt was referred to a new PCP but unable to get appt until october

## 2015-04-20 NOTE — ED Notes (Signed)
States she is having pain and stiffness to neck for about 3 days .The patient has a history of of same

## 2015-04-20 NOTE — Discharge Instructions (Signed)
Cervical Sprain  A cervical sprain is an injury in the neck in which the strong, fibrous tissues (ligaments) that connect your neck bones stretch or tear. Cervical sprains can range from mild to severe. Severe cervical sprains can cause the neck vertebrae to be unstable. This can lead to damage of the spinal cord and can result in serious nervous system problems. The amount of time it takes for a cervical sprain to get better depends on the cause and extent of the injury. Most cervical sprains heal in 1 to 3 weeks.  CAUSES   Severe cervical sprains may be caused by:    Contact sport injuries (such as from football, rugby, wrestling, hockey, auto racing, gymnastics, diving, martial arts, or boxing).    Motor vehicle collisions.    Whiplash injuries. This is an injury from a sudden forward and backward whipping movement of the head and neck.   Falls.   Mild cervical sprains may be caused by:    Being in an awkward position, such as while cradling a telephone between your ear and shoulder.    Sitting in a chair that does not offer proper support.    Working at a poorly designed computer station.    Looking up or down for long periods of time.   SYMPTOMS    Pain, soreness, stiffness, or a burning sensation in the front, back, or sides of the neck. This discomfort may develop immediately after the injury or slowly, 24 hours or more after the injury.    Pain or tenderness directly in the middle of the back of the neck.    Shoulder or upper back pain.    Limited ability to move the neck.    Headache.    Dizziness.    Weakness, numbness, or tingling in the hands or arms.    Muscle spasms.    Difficulty swallowing or chewing.    Tenderness and swelling of the neck.   DIAGNOSIS   Most of the time your health care provider can diagnose a cervical sprain by taking your history and doing a physical exam. Your health care provider will ask about previous neck injuries and any known neck  problems, such as arthritis in the neck. X-rays may be taken to find out if there are any other problems, such as with the bones of the neck. Other tests, such as a CT scan or MRI, may also be needed.   TREATMENT   Treatment depends on the severity of the cervical sprain. Mild sprains can be treated with rest, keeping the neck in place (immobilization), and pain medicines. Severe cervical sprains are immediately immobilized. Further treatment is done to help with pain, muscle spasms, and other symptoms and may include:   Medicines, such as pain relievers, numbing medicines, or muscle relaxants.    Physical therapy. This may involve stretching exercises, strengthening exercises, and posture training. Exercises and improved posture can help stabilize the neck, strengthen muscles, and help stop symptoms from returning.   HOME CARE INSTRUCTIONS    Put ice on the injured area.    Put ice in a plastic bag.    Place a towel between your skin and the bag.    Leave the ice on for 15-20 minutes, 3-4 times a day.    If your injury was severe, you may have been given a cervical collar to wear. A cervical collar is a two-piece collar designed to keep your neck from moving while it heals.   Do not   remove the collar unless instructed by your health care provider.   If you have long hair, keep it outside of the collar.   Ask your health care provider before making any adjustments to your collar. Minor adjustments may be required over time to improve comfort and reduce pressure on your chin or on the back of your head.   Ifyou are allowed to remove the collar for cleaning or bathing, follow your health care provider's instructions on how to do so safely.   Keep your collar clean by wiping it with mild soap and water and drying it completely. If the collar you have been given includes removable pads, remove them every 1-2 days and hand wash them with soap and water. Allow them to air dry. They should be completely  dry before you wear them in the collar.   If you are allowed to remove the collar for cleaning and bathing, wash and dry the skin of your neck. Check your skin for irritation or sores. If you see any, tell your health care provider.   Do not drive while wearing the collar.    Only take over-the-counter or prescription medicines for pain, discomfort, or fever as directed by your health care provider.    Keep all follow-up appointments as directed by your health care provider.    Keep all physical therapy appointments as directed by your health care provider.    Make any needed adjustments to your workstation to promote good posture.    Avoid positions and activities that make your symptoms worse.    Warm up and stretch before being active to help prevent problems.   SEEK MEDICAL CARE IF:    Your pain is not controlled with medicine.    You are unable to decrease your pain medicine over time as planned.    Your activity level is not improving as expected.   SEEK IMMEDIATE MEDICAL CARE IF:    You develop any bleeding.   You develop stomach upset.   You have signs of an allergic reaction to your medicine.    Your symptoms get worse.    You develop new, unexplained symptoms.    You have numbness, tingling, weakness, or paralysis in any part of your body.   MAKE SURE YOU:    Understand these instructions.   Will watch your condition.   Will get help right away if you are not doing well or get worse.  Document Released: 05/30/2007 Document Revised: 08/07/2013 Document Reviewed: 02/07/2013  [employer] Patient Information 2015 [employer]. This information is not intended to replace advice given to you by your health care provider. Make sure you discuss any questions you have with your health care provider.    Muscle Strain  A muscle strain is an injury that occurs when a muscle is stretched beyond its normal length. Usually a small number of muscle fibers are torn when this happens.  Muscle strain is rated in degrees. First-degree strains have the least amount of muscle fiber tearing and pain. Second-degree and third-degree strains have increasingly more tearing and pain.   Usually, recovery from muscle strain takes 1-2 weeks. Complete healing takes 5-6 weeks.   CAUSES   Muscle strain happens when a sudden, violent force placed on a muscle stretches it too far. This may occur with lifting, sports, or a fall.   RISK FACTORS  Muscle strain is especially common in athletes.   SIGNS AND SYMPTOMS  At the site of the muscle strain, there   may be:   Pain.   Bruising.   Swelling.   Difficulty using the muscle due to pain or lack of normal function.  DIAGNOSIS   Your health care provider will perform a physical exam and ask about your medical history.  TREATMENT   Often, the best treatment for a muscle strain is resting, icing, and applying cold compresses to the injured area.   HOME CARE INSTRUCTIONS    Use the PRICE method of treatment to promote muscle healing during the first 2-3 days after your injury. The PRICE method involves:   Protecting the muscle from being injured again.   Restricting your activity and resting the injured body part.   Icing your injury. To do this, put ice in a plastic bag. Place a towel between your skin and the bag. Then, apply the ice and leave it on from 15-20 minutes each hour. After the third day, switch to moist heat packs.   Apply compression to the injured area with a splint or elastic bandage. Be careful not to wrap it too tightly. This may interfere with blood circulation or increase swelling.   Elevate the injured body part above the level of your heart as often as you can.   Only take over-the-counter or prescription medicines for pain, discomfort, or fever as directed by your health care provider.   Warming up prior to exercise helps to prevent future muscle strains.  SEEK MEDICAL CARE IF:    You have increasing pain or swelling in the injured  area.   You have numbness, tingling, or a significant loss of strength in the injured area.  MAKE SURE YOU:    Understand these instructions.   Will watch your condition.   Will get help right away if you are not doing well or get worse.  Document Released: 08/02/2005 Document Revised: 05/23/2013 Document Reviewed: 03/01/2013  [employer] Patient Information 2015 [employer]. This information is not intended to replace advice given to you by your health care provider. Make sure you discuss any questions you have with your health care provider.

## 2015-05-02 ENCOUNTER — Ambulatory Visit
Admission: EM | Admit: 2015-05-02 | Discharge: 2015-05-02 | Disposition: A | Discharge status: Home / Self Care | Payer: 59 | Attending: Family Medicine | Admitting: Family Medicine

## 2015-05-02 ENCOUNTER — Encounter: Payer: Self-pay | Admitting: Emergency Medicine

## 2015-05-02 DIAGNOSIS — G43009 Migraine without aura, not intractable, without status migrainosus: Secondary | ICD-10-CM

## 2015-05-02 DIAGNOSIS — G44209 Tension-type headache, unspecified, not intractable: Secondary | ICD-10-CM

## 2015-05-02 MED ORDER — RIZATRIPTAN BENZOATE 10 MG PO TBDP: 10.0000 mg | ORAL_TABLET | ORAL | Status: DC | PRN

## 2015-05-02 MED ORDER — PROMETHAZINE HCL 25 MG PO TABS: 25.0000 mg | ORAL_TABLET | Freq: Three times a day (TID) | ORAL | Status: DC | PRN

## 2015-05-02 MED ORDER — KETOROLAC TROMETHAMINE 60 MG/2ML IM SOLN
60.0000 mg | Freq: Once | INTRAMUSCULAR | Status: AC
  Administered 2015-05-02: 60 mg via INTRAMUSCULAR

## 2015-05-02 MED ORDER — CYCLOBENZAPRINE HCL 10 MG PO TABS: 10.0000 mg | ORAL_TABLET | Freq: Every day | ORAL | Status: DC

## 2015-05-02 MED ORDER — KETOROLAC TROMETHAMINE 10 MG PO TABS: 10.0000 mg | ORAL_TABLET | Freq: Three times a day (TID) | ORAL | Status: DC | PRN

## 2015-05-02 NOTE — ED Notes (Signed)
Patient c/o right sided temple headache and pain behind the right eye since yesterday with some nausea.

## 2015-05-02 NOTE — ED Provider Notes (Signed)
CSN: [id number]     Arrival date & time 05/02/15  0914 History   First MD Initiated Contact with Patient 05/02/15 [phone]     Chief Complaint  Patient presents with  . Headache  . Nausea   (Consider location/radiation/quality/duration/timing/severity/associated sxs/prior Treatment) HPI Comments: 39 yo female with a 1 day h/o right sided headache associated with nausea and photophobia. Denies any vomiting, fevers, chills, vision changes, injuries, falls, numbness/tingling.  States does not have a h/o frequent migraines; states maybe once or twice in the past has had a migraine headache. Also complains of upper back, neck and back of head muscle tension.   Patient is a 39 y.o. female presenting with headaches. The history is provided by the patient.  Headache   Past Medical History  Diagnosis Date  . ASD (atrial septal defect)   . Hx SBO    Past Surgical History  Procedure Laterality Date  . Cholecystectomy    . 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 Diabetes Neg Hx    Social History  Substance Use Topics  . Smoking status: Never Smoker   . Smokeless tobacco: None  . Alcohol Use: No   OB History    No data available     Review of Systems  Neurological: Positive for headaches.    Allergies  Ultram  Home Medications   Prior to Admission medications   Medication Sig Start Date End Date Taking? Authorizing Provider  acetaminophen (TYLENOL) 500 MG tablet Take 1,000 mg by mouth every 6 (six) hours as needed.   Yes Historical Provider, MD  ibuprofen (ADVIL,MOTRIN) 600 MG tablet Take 600 mg by mouth every 6 (six) hours as needed.   Yes Historical Provider, MD  pantoprazole (PROTONIX) 40 MG tablet Take 40 mg by mouth daily.   Yes Historical Provider, MD  baclofen (LIORESAL) 10 MG tablet Take 1 tablet (10 mg total) by mouth 3 (three) times daily. 04/20/15   Charmayne Sheer Beers, PA-C   cyclobenzaprine (FLEXERIL) 10 MG tablet Take 1 tablet (10 mg total) by mouth at bedtime. 05/02/15   [name], MD  diclofenac (VOLTAREN) 75 MG EC tablet Take 1 tablet (75 mg total) by mouth 2 (two) times daily. 04/20/15   Evangeline Dakin, PA-C  HYDROcodone-acetaminophen (NORCO/VICODIN) 5-325 MG per tablet Take 1-2 tablets by mouth every 6 (six) hours as needed. 04/07/15   [name], MD  ketorolac (TORADOL) 10 MG tablet Take 1 tablet (10 mg total) by mouth every 8 (eight) hours as needed. For headache 05/02/15   [name], MD  levofloxacin (LEVAQUIN) 750 MG tablet Take 1 tablet (750 mg total) by mouth daily. 04/04/15   Shaune Pollack, MD  oxyCODONE-acetaminophen (ROXICET) 5-325 MG per tablet Take 1-2 tablets by mouth every 4 (four) hours as needed for severe pain. 04/20/15   Evangeline Dakin, PA-C  promethazine (PHENERGAN) 25 MG tablet Take 1 tablet (25 mg total) by mouth every 8 (eight) hours as needed for nausea or vomiting. 05/02/15   [name], MD  rizatriptan (MAXALT-MLT) 10 MG disintegrating tablet Take 1 tablet (10 mg total) by mouth as needed for migraine. May repeat in 2 hours if needed; max of  in 24 hour period 05/02/15   [name], MD  ursodiol (ACTIGALL) 300 MG capsule Take 1 capsule (300 mg total) by mouth 2 (two) times daily. 04/04/15   Qing  Imogene Burn, MD   Meds Ordered and Administered this Visit   Medications  ketorolac (TORADOL) injection 60 mg (60 mg Intramuscular Given 05/02/15 1020)    BP 159/90 mmHg  Pulse 75  Temp(Src) 99.6 F (37.6 C) (Tympanic)  Resp 16  Ht 5\' 7"  (1.702 m)  Wt 175 lb (79.379 kg)  BMI 27.40 kg/m2  SpO2 100%  LMP 05/02/2015 (Exact Date) No data found.   Physical Exam  Constitutional: She is oriented to person, place, and time. She appears well-developed and well-nourished. No distress.  HENT:  Head: Normocephalic and atraumatic.  Eyes: Conjunctivae and EOM are normal. Pupils are equal, round, and reactive to light. Right eye exhibits no  discharge. Left eye exhibits no discharge.  Neck: Normal range of motion. Neck supple. No thyromegaly present.  Cardiovascular: Normal rate.   Pulmonary/Chest: Effort normal. No respiratory distress.  Lymphadenopathy:    She has no cervical adenopathy.  Neurological: She is alert and oriented to person, place, and time. She has normal reflexes. She displays normal reflexes. No cranial nerve deficit. She exhibits normal muscle tone. Coordination normal.  Skin: No rash noted. She is not diaphoretic.  Nursing note and vitals reviewed.   ED Course  Procedures (including critical care time)  Labs Review Labs Reviewed - No data to display  Imaging Review No results found.   Visual Acuity Review  Right Eye Distance:   Left Eye Distance:   Bilateral Distance:    Right Eye Near:   Left Eye Near:    Bilateral Near:         MDM   1. Migraine without aura and without status migrainosus, not intractable   2. Muscle tension headache    Discharge Medication List as of 05/02/2015 10:55 AM    START taking these medications   Details  cyclobenzaprine (FLEXERIL) 10 MG tablet Take 1 tablet (10 mg total) by mouth at bedtime., Starting 05/02/2015, Until Discontinued, Normal    ketorolac (TORADOL) 10 MG tablet Take 1 tablet (10 mg total) by mouth every 8 (eight) hours as needed. For headache, Starting 05/02/2015, Until Discontinued, Normal    promethazine (PHENERGAN) 25 MG tablet Take 1 tablet (25 mg total) by mouth every 8 (eight) hours as needed for nausea or vomiting., Starting 05/02/2015, Until Discontinued, Normal    rizatriptan (MAXALT-MLT) 10 MG disintegrating tablet Take 1 tablet (10 mg total) by mouth as needed for migraine. May repeat in 2 hours if needed; max of 30mg  in 24 hour period, Starting 05/02/2015, Until Discontinued, Print      Plan: 1. diagnosis reviewed with patient 2. rx as per orders; risks, benefits, potential side effects reviewed with patient 3. Patient given  Toradol 60mg  IM x1 in clinic with improvement of symptoms 3. Recommend supportive treatment with rest, increased fluids 4. Establish with PCP and f/u prn if symptoms worsen or don't improve    [name], MD 05/02/15 1132

## 2015-05-07 ENCOUNTER — Encounter: Admission: RE | Payer: Self-pay | Source: Ambulatory Visit

## 2015-05-07 ENCOUNTER — Ambulatory Visit: Admission: RE | Admit: 2015-05-07 | Payer: 59 | Source: Ambulatory Visit | Admitting: Unknown Physician Specialty

## 2015-05-07 SURGERY — ESOPHAGOGASTRODUODENOSCOPY (EGD) WITH PROPOFOL
Anesthesia: General

## 2015-08-08 ENCOUNTER — Other Ambulatory Visit: Payer: Self-pay | Admitting: Neurology

## 2015-08-08 DIAGNOSIS — G43701 Chronic migraine without aura, not intractable, with status migrainosus: Secondary | ICD-10-CM

## 2015-08-15 ENCOUNTER — Ambulatory Visit
Admission: RE | Admit: 2015-08-15 | Discharge: 2015-08-15 | Disposition: A | Discharge status: Home / Self Care | Payer: 59 | Source: Ambulatory Visit | Attending: Neurology | Admitting: Neurology

## 2015-08-15 DIAGNOSIS — G43701 Chronic migraine without aura, not intractable, with status migrainosus: Secondary | ICD-10-CM

## 2015-08-15 DIAGNOSIS — M542 Cervicalgia: Secondary | ICD-10-CM | POA: Diagnosis present

## 2015-08-15 DIAGNOSIS — R51 Headache: Secondary | ICD-10-CM | POA: Insufficient documentation

## 2015-08-15 MED ORDER — GADOBENATE DIMEGLUMINE 529 MG/ML IV SOLN
20.0000 mL | Freq: Once | INTRAVENOUS | Status: AC | PRN
  Administered 2015-08-15: 17 mL via INTRAVENOUS

## 2015-10-27 ENCOUNTER — Encounter: Payer: Self-pay | Admitting: Emergency Medicine

## 2015-10-27 ENCOUNTER — Ambulatory Visit
Admission: EM | Admit: 2015-10-27 | Discharge: 2015-10-27 | Disposition: A | Discharge status: Home / Self Care | Payer: 59 | Attending: Family Medicine | Admitting: Family Medicine

## 2015-10-27 DIAGNOSIS — R51 Headache: Secondary | ICD-10-CM

## 2015-10-27 DIAGNOSIS — H00036 Abscess of eyelid left eye, unspecified eyelid: Secondary | ICD-10-CM

## 2015-10-27 DIAGNOSIS — R519 Headache, unspecified: Secondary | ICD-10-CM

## 2015-10-27 HISTORY — DX: Unspecified intestinal obstruction, unspecified as to partial versus complete obstruction: K56.609

## 2015-10-27 HISTORY — DX: Occipital neuralgia: M54.81

## 2015-10-27 MED ORDER — RIZATRIPTAN BENZOATE 10 MG PO TBDP: 10.0000 mg | ORAL_TABLET | ORAL | Status: DC | PRN

## 2015-10-27 MED ORDER — CEPHALEXIN 500 MG PO CAPS: 500.0000 mg | ORAL_CAPSULE | Freq: Three times a day (TID) | ORAL | Status: DC

## 2015-10-27 NOTE — ED Notes (Signed)
Patient c/o left eye redness, swelling and pain started yesterday.

## 2015-10-27 NOTE — ED Provider Notes (Signed)
CSN: [id number]     Arrival date & time 10/27/15  4098 History   None    Chief Complaint  Patient presents with  . Eye Pain  . Eye Problem   (Consider location/radiation/quality/duration/timing/severity/associated sxs/prior Treatment) HPI Comments: 40 yo female with a c/o 1 day of mild left eyelid redness, swelling and pain. Denies any drainage, fevers ,chills. Has had a left sided headache as well associated with photophobia. Denies nausea or vomiting. 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 States in the past maxalt has helped with her headaches.  Patient is a 40 y.o. female presenting with eye pain and eye problem. The history is provided by the patient.  Eye Pain  Eye Problem   Past Medical History  Diagnosis Date  . ASD (atrial septal defect)   . Hx SBO   . Bowel obstruction (HCC)   . Occipital neuralgia    Past Surgical History  Procedure Laterality Date  . Cholecystectomy    . 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 Abdominal surgery     Family History  Problem Relation Age of Onset  . Diabetes Neg Hx    Social History  Substance Use Topics  . Smoking status: Never Smoker   . Smokeless tobacco: None  . Alcohol Use: No   OB History    No data available     Review of Systems  Eyes: Positive for pain.    Allergies  Ultram  Home Medications   Prior to Admission medications   Medication Sig Start Date End Date Taking? Authorizing Provider  cholecalciferol (VITAMIN D) 1000 units tablet Take 1,000 Units by mouth daily.   Yes Historical Provider, MD  diazepam (VALIUM) 5 MG tablet Take 5 mg by mouth every 6 (six) hours as needed for anxiety.   Yes Historical Provider, MD  lisinopril (PRINIVIL,ZESTRIL) 20 MG tablet Take 20 mg by mouth daily.   Yes Historical Provider, MD  pregabalin (LYRICA) 75 MG capsule Take 75 mg by mouth 2 (two) times daily.    Yes Historical Provider, MD  acetaminophen (TYLENOL) 500 MG tablet Take 1,000 mg by mouth every 6 (six) hours as needed.    Historical Provider, MD  baclofen (LIORESAL) 10 MG tablet Take 1 tablet (10 mg total) by mouth 3 (three) times daily. 04/20/15   Charmayne Sheer Beers, PA-C  cephALEXin (KEFLEX) 500 MG capsule Take 1 capsule (500 mg total) by mouth 3 (three) times daily. 10/27/15   [name], MD  cyclobenzaprine (FLEXERIL) 10 MG tablet Take 1 tablet (10 mg total) by mouth at bedtime. 05/02/15   [name], MD  diclofenac (VOLTAREN) 75 MG EC tablet Take 1 tablet (75 mg total) by mouth 2 (two) times daily. 04/20/15   Evangeline Dakin, PA-C  HYDROcodone-acetaminophen (NORCO/VICODIN) 5-325 MG per tablet Take 1-2 tablets by mouth every 6 (six) hours as needed. 04/07/15   [name], MD  ibuprofen (ADVIL,MOTRIN) 600 MG tablet Take 600 mg by mouth every 6 (six) hours as needed.    Historical Provider, MD  ketorolac (TORADOL) 10 MG tablet Take 1 tablet (10 mg total) by mouth every 8 (eight) hours as needed. For headache 05/02/15   [name], MD  levofloxacin (LEVAQUIN) 750 MG tablet Take 1 tablet (750 mg total) by mouth daily. 04/04/15   Shaune Pollack, MD  oxyCODONE-acetaminophen (ROXICET) 5-325 MG per tablet Take  1-2 tablets by mouth every 4 (four) hours as needed for severe pain. 04/20/15   Charmayne Sheerharles M Beers, PA-C  pantoprazole (PROTONIX) 40 MG tablet Take 40 mg by mouth daily.    Historical Provider, MD  promethazine (PHENERGAN) 25 MG tablet Take 1 tablet (25 mg total) by mouth every 8 (eight) hours as needed for nausea or vomiting. 05/02/15   Payton Mccallumrlando Kaelyn Innocent, MD  rizatriptan (MAXALT-MLT) 10 MG disintegrating tablet Take 1 tablet (10 mg total) by mouth as needed for migraine. May repeat in 2 hours if needed 10/27/15   Payton Mccallumrlando Lalo Tromp, MD  ursodiol (ACTIGALL) 300 MG capsule Take 1 capsule (300 mg total) by mouth 2 (two) times daily. 04/04/15   Shaune PollackQing Chen, MD   Meds Ordered and Administered this Visit  Medications - No  data to display  BP 119/80 mmHg  Pulse 80  Temp(Src) 97.3 F (36.3 C) (Tympanic)  Resp 16  Ht 5\' 7"  (1.702 m)  Wt 183 lb (83.008 kg)  BMI 28.66 kg/m2  SpO2 100%  LMP 10/22/2015 (Exact Date) No data found.   Physical Exam  Constitutional: She is oriented to person, place, and time. She appears well-developed and well-nourished. No distress.  HENT:  Head: Normocephalic and atraumatic.  Right Ear: Tympanic membrane, external ear and ear canal normal.  Left Ear: Tympanic membrane, external ear and ear canal normal.  Nose: Mucosal edema and rhinorrhea present. No nose lacerations, sinus tenderness, nasal deformity, septal deviation or nasal septal hematoma. No epistaxis.  No foreign bodies. Right sinus exhibits maxillary sinus tenderness and frontal sinus tenderness. Left sinus exhibits maxillary sinus tenderness and frontal sinus tenderness.  Mouth/Throat: Uvula is midline, oropharynx is clear and moist and mucous membranes are normal. No oropharyngeal exudate.  Eyes: Conjunctivae and EOM are normal. Pupils are equal, round, and reactive to light. Right eye exhibits no discharge. Left eye exhibits no discharge. No scleral icterus.  Left upper eyelid with mild edema, erythema, warmth and mild tenderness to palpation  Neck: Normal range of motion. Neck supple. No thyromegaly present.  Cardiovascular: Normal rate, regular rhythm and normal heart sounds.   Pulmonary/Chest: Effort normal and breath sounds normal. No respiratory distress. She has no wheezes. She has no rales.  Lymphadenopathy:    She has no cervical adenopathy.  Neurological: She is alert and oriented to person, place, and time. She has normal reflexes. She displays normal reflexes. No cranial nerve deficit. She exhibits normal muscle tone. Coordination normal.  Skin: She is not diaphoretic.  Nursing note and vitals reviewed.   ED Course  Procedures (including critical care time)  Labs Review Labs Reviewed - No data to  display  Imaging Review No results found.   Visual Acuity Review  Right Eye Distance: 20/25 corrected Left Eye Distance: 20/30 corrected Bilateral Distance:    Right Eye Near:   Left Eye Near:    Bilateral Near:         MDM   1. Cellulitis of eyelid, left   2. Headache, unspecified headache type    Discharge Medication List as of 10/27/2015 11:22 AM    START taking these medications   Details  cephALEXin (KEFLEX) 500 MG capsule Take 1 capsule (500 mg total) by mouth 3 (three) times daily., Starting 10/27/2015, Until Discontinued, Normal       1. diagnosis reviewed with patient 2. rx as per orders above; reviewed possible side effects, interactions, risks and benefits; rx also for maxalt as per orders 3. Recommend supportive treatment with otc  analgesics prn; warm compresses 4. Follow-up prn if symptoms worsen or don't improve    [name], MD 10/27/15 1136

## 2017-05-14 ENCOUNTER — Ambulatory Visit
Admission: EM | Admit: 2017-05-14 | Discharge: 2017-05-14 | Disposition: A | Discharge status: Home / Self Care | Payer: 59 | Attending: Family Medicine | Admitting: Family Medicine

## 2017-05-14 ENCOUNTER — Ambulatory Visit (INDEPENDENT_AMBULATORY_CARE_PROVIDER_SITE_OTHER): Payer: 59

## 2017-05-14 DIAGNOSIS — J209 Acute bronchitis, unspecified: Secondary | ICD-10-CM

## 2017-05-14 DIAGNOSIS — J9801 Acute bronchospasm: Secondary | ICD-10-CM

## 2017-05-14 DIAGNOSIS — J22 Unspecified acute lower respiratory infection: Secondary | ICD-10-CM | POA: Diagnosis not present

## 2017-05-14 MED ORDER — METHYLPREDNISOLONE SODIUM SUCC 125 MG IJ SOLR
125.0000 mg | Freq: Once | INTRAMUSCULAR | Status: AC
  Administered 2017-05-14: 125 mg via INTRAMUSCULAR

## 2017-05-14 MED ORDER — BENZONATATE 200 MG PO CAPS: 200.0000 mg | ORAL_CAPSULE | Freq: Three times a day (TID) | ORAL | 0 refills | Status: DC | PRN

## 2017-05-14 MED ORDER — FLUCONAZOLE 150 MG PO TABS: 150.0000 mg | ORAL_TABLET | Freq: Once | ORAL | 0 refills | Status: AC

## 2017-05-14 MED ORDER — LEVOFLOXACIN 750 MG PO TABS: 750.0000 mg | ORAL_TABLET | Freq: Every day | ORAL | 0 refills | Status: DC

## 2017-05-14 MED ORDER — IPRATROPIUM-ALBUTEROL 0.5-2.5 (3) MG/3ML IN SOLN
3.0000 mL | Freq: Once | RESPIRATORY_TRACT | Status: AC
Start: 2017-05-14 — End: 2017-05-14
  Administered 2017-05-14: 3 mL via RESPIRATORY_TRACT

## 2017-05-14 MED ORDER — PREDNISONE 10 MG (21) PO TBPK: ORAL_TABLET | ORAL | 0 refills | Status: DC

## 2017-05-14 MED ORDER — ALBUTEROL SULFATE HFA 108 (90 BASE) MCG/ACT IN AERS: 2.0000 | INHALATION_SPRAY | RESPIRATORY_TRACT | 0 refills | Status: DC | PRN

## 2017-05-14 NOTE — ED Provider Notes (Signed)
[facility]    CSN: [id number] Arrival date & time: 05/14/17  0840     History   Chief Complaint Chief Complaint  Patient presents with  . Shortness of Breath    HPI Helen Burgess is a 41 y.o. female.   Patient is a 41 year old white female who's come in with shortness of breath and difficulty breathing. Everything started on Tuesday but has progressively gotten worse. She's had pneumonia before and when she had pneumonia before she had difficulty breathing like this. She reports difficult to breathing throughout her whole chest hurts when she takes a deep breath is no chest pain when she is not trying to take a deep breath or breathe. She reports coughing up yellowish green sputum. She thought she could take care of this herself. She does not smoke but husband smokes heavily around her. She's allergic she thinks Toradol and tramadol. 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 She's also had intestinal obstruction as well. She also has occipital neuralgia she is having acute pancreatitis she states from overdosing and cyst developing stomach ulcers. She states she's never smoked. She thinks her pulse ox is using about 100% and she denies any significant family medical history. She has had 1 child a son who had no congenital defects. She will force over-the-counter medication has not helped her   The history is provided by the patient.  Shortness of Breath  Severity:  Severe Onset quality:  Sudden Progression:  Worsening Chronicity:  New Context: smoke exposure and URI   Relieved by:  Nothing Worsened by:  Deep breathing and coughing Ineffective treatments:  None tried Associated symptoms: cough, diaphoresis and sputum production     Past Medical History:  Diagnosis Date  . ASD (atrial septal defect)   . Bowel obstruction (HCC)   . Hx SBO   . Occipital neuralgia     Patient Active Problem List   Diagnosis Date Noted  .  Acute pancreatitis 03/28/2015    Past Surgical History:  Procedure Laterality Date  . ABDOMINAL SURGERY    . CHOLECYSTECTOMY    . 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 Authorizing Provider  cholecalciferol (VITAMIN D) 1000 units tablet Take 1,000 Units by mouth daily.   Yes [provider]  cyclobenzaprine (FLEXERIL) 10 MG tablet Take 1 tablet (10 mg total) by mouth at bedtime. 05/02/15  Yes [name], MD  HYDROcodone-acetaminophen (NORCO/VICODIN) 5-325 MG per tablet Take 1-2 tablets by mouth every 6 (six) hours as needed. 04/07/15  Yes [name], MD  rizatriptan (MAXALT-MLT) 10 MG disintegrating tablet Take 1 tablet (10 mg total) by mouth as needed for migraine. May repeat in 2 hours if needed 10/27/15  Yes Conty, Pamala Hurry, MD  acetaminophen (TYLENOL) 500 MG tablet Take 1,000 mg by mouth every 6 (six) hours as needed.    [provider]  albuterol (PROVENTIL HFA;VENTOLIN HFA) 108 (90 Base) MCG/ACT inhaler Inhale 2 puffs into the lungs every 4 (four) hours as needed. 05/14/17   Hassan Rowan, MD  baclofen (LIORESAL) 10 MG tablet Take 1 tablet (10 mg total) by mouth 3 (three) times daily. 04/20/15   Beers, Charmayne Sheer, PA-C  benzonatate (TESSALON) 200 MG capsule Take 1  capsule (200 mg total) by mouth 3 (three) times daily as needed. 05/14/17   Hassan Rowan, MD  cephALEXin (KEFLEX) 500 MG capsule Take 1 capsule (500 mg total) by mouth 3 (three) times daily. 10/27/15   [name], MD  diazepam (VALIUM) 5 MG tablet Take 5 mg by mouth every 6 (six) hours as needed for anxiety.    [provider]  diclofenac (VOLTAREN) 75 MG EC tablet Take 1 tablet (75 mg total) by mouth 2 (two) times daily. 04/20/15   Beers, Charmayne Sheer, PA-C  fluconazole (DIFLUCAN) 150 MG tablet Take 1 tablet (150 mg total)  by mouth once. 05/14/17 05/14/17  Hassan Rowan, MD  ibuprofen (ADVIL,MOTRIN) 600 MG tablet Take 600 mg by mouth every 6 (six) hours as needed.    [provider]  ketorolac (TORADOL) 10 MG tablet Take 1 tablet (10 mg total) by mouth every 8 (eight) hours as needed. For headache 05/02/15   [name], MD  levofloxacin (LEVAQUIN) 750 MG tablet Take 1 tablet (750 mg total) by mouth daily. 04/04/15   Shaune Pollack, MD  levofloxacin (LEVAQUIN) 750 MG tablet Take 1 tablet (750 mg total) by mouth daily. 05/14/17   Hassan Rowan, MD  lisinopril (PRINIVIL,ZESTRIL) 20 MG tablet Take 20 mg by mouth daily.    [provider]  oxyCODONE-acetaminophen (ROXICET) 5-325 MG per tablet Take 1-2 tablets by mouth every 4 (four) hours as needed for severe pain. 04/20/15   Beers, Charmayne Sheer, PA-C  pantoprazole (PROTONIX) 40 MG tablet Take 40 mg by mouth daily.    [provider]  predniSONE (STERAPRED UNI-PAK 21 TAB) 10 MG (21) TBPK tablet Sig 6 tablet day 1, 5 tablets day 2, 4 tablets day 3,,3tablets day 4, 2 tablets day 5, 1 tablet day 6 take all tablets orally 05/14/17   Hassan Rowan, MD  pregabalin (LYRICA) 75 MG capsule Take 75 mg by mouth 2 (two) times daily.    [provider]  promethazine (PHENERGAN) 25 MG tablet Take 1 tablet (25 mg total) by mouth every 8 (eight) hours as needed for nausea or vomiting. 05/02/15   [name], MD  ursodiol (ACTIGALL) 300 MG capsule Take 1 capsule (300 mg total) by mouth 2 (two) times daily. 04/04/15   Shaune Pollack, MD    Family History Family History  Problem Relation Age of Onset  . Diabetes Neg Hx     Social History Social History  Substance Use Topics  . Smoking status: Never Smoker  . Smokeless tobacco: Never Used  . Alcohol use No     Allergies   Ultram [tramadol]   Review of Systems Review of Systems  Constitutional: Positive for diaphoresis.  Respiratory: Positive for cough, sputum production and shortness of breath.   All  other systems reviewed and are negative.    Physical Exam Triage Vital Signs ED Triage Vitals  Enc Vitals Group     BP 05/14/17 0844 (!) 178/100     Pulse Rate 05/14/17 0844 (!) 112     Resp 05/14/17 0844 (!) 21     Temp --      Temp src --      SpO2 05/14/17 0844 96 %     Weight 05/14/17 0847 172 lb (78 kg)     Height 05/14/17 0847  (1.702 m)     Head Circumference --      Peak Flow --      Pain Score 05/14/17 0847 9     Pain  Loc --      Pain Edu? --      Excl. in GC? --    No data found.   Updated Vital Signs BP (!) 178/100 (BP Location: Left Arm)   Pulse (!) 112   Resp (!) 21   Ht  (1.702 m)   Wt 172 lb (78 kg)   LMP 05/12/2017   SpO2 96%   BMI 26.94 kg/m   Visual Acuity Right Eye Distance:   Left Eye Distance:   Bilateral Distance:    Right Eye Near:   Left Eye Near:    Bilateral Near:     Physical Exam  Constitutional: She appears well-developed and well-nourished. She appears distressed.  HENT:  Head: Normocephalic.  Right Ear: External ear normal.  Left Ear: External ear normal.  Mouth/Throat: Oropharynx is clear and moist.  Eyes: Pupils are equal, round, and reactive to light. EOM are normal.  Neck: Normal range of motion. Neck supple. No tracheal deviation present.  Cardiovascular: Normal rate, regular rhythm and normal heart sounds.   Pulmonary/Chest: Tachypnea noted. She is in respiratory distress. She has wheezes.  Patient was also diaphoretic sweating and having marked bronchospasm  Abdominal: Soft.  Musculoskeletal: Normal range of motion.  Neurological: She is alert.  Skin: Skin is warm.  Psychiatric: She has a normal mood and affect.     UC Treatments / Results  Labs (all labs ordered are listed, but only abnormal results are displayed) Labs Reviewed - No data to display  EKG  EKG Interpretation None       Radiology Dg Chest 2 View  Result Date: 05/14/2017 CLINICAL DATA:  Pt reported to have been sick since  last Thursday. Pt states that she has been coughing so much and so hard that it has caused her to urinate on herself and vomit. Congested. EXAM: CHEST  2 VIEW COMPARISON:  04/07/2015 FINDINGS: Lungs are mildly hyperinflated. Heart is mildly enlarged but stable in configuration. There are no focal consolidations or pleural effusions. Stable postoperative changes in the anterior mediastinum. IMPRESSION: No active cardiopulmonary disease.  Stable cardiomegaly. Electronically Signed   By: Norva Pavlov M.D.   On: 05/14/2017 09:58    Procedures Procedures (including critical care time)  Medications Ordered in UC Medications  methylPREDNISolone sodium succinate (SOLU-MEDROL) 125 mg/2 mL injection 125 mg (125 mg Intramuscular Given 05/14/17 0859)  ipratropium-albuterol (DUONEB) 0.5-2.5 (3) MG/3ML nebulizer solution 3 mL (3 mLs Nebulization Given 05/14/17 0901)     Initial Impression / Assessment and Plan / UC Course  I have reviewed the triage vital signs and the nursing notes.  Pertinent labs & imaging results that were available during my care of the patient were reviewed by me and considered in my medical decision making (see chart for details).   I've written informed patient we'll give her a shot of Solu-Medrol breathing treatment DuoNeb and chest x-ray if this does not turn things around with her pulse ox being in the 96 we will sent to the hospital 2 L of oxygen will also be started as well  Chest x-ray was negative on her we'll place on Levaquin 750 Diflucan per her request albuterol inhaler since she does not have an inhaler Tessalon Perles and 6 day course of prednisone. She is breathing better at this time and will wean off her O2 strongly suggest that she avoids anyone smoking around her.  Final Clinical Impressions(s) / UC Diagnoses   Final diagnoses:  Lower respiratory infection (e.g.,  bronchitis, pneumonia, pneumonitis, pulmonitis)  Bronchospasm with bronchitis, acute    New  Prescriptions New Prescriptions   ALBUTEROL (PROVENTIL HFA;VENTOLIN HFA) 108 (90 BASE) MCG/ACT INHALER    Inhale 2 puffs into the lungs every 4 (four) hours as needed.   BENZONATATE (TESSALON) 200 MG CAPSULE    Take 1 capsule (200 mg total) by mouth 3 (three) times daily as needed.   FLUCONAZOLE (DIFLUCAN) 150 MG TABLET    Take 1 tablet (150 mg total) by mouth once.   LEVOFLOXACIN (LEVAQUIN) 750 MG TABLET    Take 1 tablet (750 mg total) by mouth daily.   PREDNISONE (STERAPRED UNI-PAK 21 TAB) 10 MG (21) TBPK TABLET    Sig 6 tablet day 1, 5 tablets day 2, 4 tablets day 3,,3tablets day 4, 2 tablets day 5, 1 tablet day 6 take all tablets orally   Note: This dictation was prepared with Dragon dictation along with smaller phrase technology. Any transcriptional errors that result from this process are unintentional.  Controlled Substance Prescriptions [place] Controlled Substance Registry consulted? Not Applicable   Hassan Rowan, MD 05/14/17 1034

## 2017-05-14 NOTE — Discharge Instructions (Signed)
Need to avoid people smoking around you and other smoke exposure

## 2017-05-14 NOTE — ED Triage Notes (Signed)
Patient complains of Shortness of breath, violent coughing, shakiness, fevers, and chest pain (Pressure, tightness), wheezing that started 9 days ago and has been constantly worsening. Patient states that she has tried OTC cough medication without relief. Patient is very upset and tearful during triage.

## 2017-08-01 ENCOUNTER — Ambulatory Visit
Admission: RE | Admit: 2017-08-01 | Discharge: 2017-08-01 | Disposition: A | Discharge status: Home / Self Care | Payer: 59 | Source: Ambulatory Visit | Attending: Nurse Practitioner | Admitting: Nurse Practitioner

## 2017-08-01 ENCOUNTER — Other Ambulatory Visit: Payer: Self-pay | Admitting: Nurse Practitioner

## 2017-08-01 DIAGNOSIS — M50322 Other cervical disc degeneration at C5-C6 level: Secondary | ICD-10-CM | POA: Diagnosis not present

## 2017-08-01 DIAGNOSIS — M25512 Pain in left shoulder: Secondary | ICD-10-CM

## 2017-10-22 ENCOUNTER — Emergency Department: Payer: 59

## 2017-10-22 ENCOUNTER — Emergency Department
Admission: EM | Admit: 2017-10-22 | Discharge: 2017-10-22 | Disposition: A | Discharge status: Home / Self Care | Payer: 59 | Attending: Emergency Medicine | Admitting: Emergency Medicine

## 2017-10-22 ENCOUNTER — Encounter: Payer: Self-pay | Admitting: Emergency Medicine

## 2017-10-22 ENCOUNTER — Other Ambulatory Visit: Payer: Self-pay

## 2017-10-22 DIAGNOSIS — R0789 Other chest pain: Secondary | ICD-10-CM | POA: Diagnosis not present

## 2017-10-22 DIAGNOSIS — F419 Anxiety disorder, unspecified: Secondary | ICD-10-CM | POA: Diagnosis not present

## 2017-10-22 DIAGNOSIS — Z79899 Other long term (current) drug therapy: Secondary | ICD-10-CM | POA: Insufficient documentation

## 2017-10-22 DIAGNOSIS — R079 Chest pain, unspecified: Secondary | ICD-10-CM | POA: Diagnosis present

## 2017-10-22 LAB — BASIC METABOLIC PANEL
ANION GAP: 9 (ref 5–15)
BUN: 9 mg/dL (ref 6–20)
CALCIUM: 9.4 mg/dL (ref 8.9–10.3)
CO2: 24 mmol/L (ref 22–32)
CREATININE: 0.56 mg/dL (ref 0.44–1.00)
Chloride: 105 mmol/L (ref 101–111)
GFR calc Af Amer: >60 mL/min (ref >60)
Glucose, Bld: 118 mg/dL — ABNORMAL HIGH (ref 65–99)
Potassium: 4.4 mmol/L (ref 3.5–5.1)
SODIUM: 138 mmol/L (ref 135–145)

## 2017-10-22 LAB — CBC
HCT: 44 % (ref 35.0–47.0)
HEMOGLOBIN: 14.7 g/dL (ref 12.0–16.0)
MCH: 28.7 pg (ref 26.0–34.0)
MCHC: 33.5 g/dL (ref 32.0–36.0)
MCV: 85.8 fL (ref 80.0–100.0)
PLATELETS: 483 10*3/uL — AB (ref 150–440)
RBC: 5.13 MIL/uL (ref 3.80–5.20)
RDW: 14.5 % (ref 11.5–14.5)
WBC: 12.7 10*3/uL — AB (ref 3.6–11.0)

## 2017-10-22 LAB — TROPONIN I

## 2017-10-22 MED ORDER — LORAZEPAM 1 MG PO TABS
1.0000 mg | ORAL_TABLET | Freq: Once | ORAL | Status: AC
Start: 2017-10-22 — End: 2017-10-22
  Administered 2017-10-22: 1 mg via ORAL
  Filled 2017-10-22: qty 1

## 2017-10-22 NOTE — ED Triage Notes (Signed)
Pt to ed with c/o chest pain that started at 10 am.  Pt states she was stressed and felt like she had to rush.  Pt states pain is worse with movement.  +sob, diaphoresis, dizziness, nervous.

## 2017-10-22 NOTE — ED Provider Notes (Signed)
[facility] Emergency Department Provider Note  ____________________________________________   I have reviewed the triage vital signs and the nursing notes. Where available I have reviewed prior notes and, if possible and indicated, outside hospital notes.    HISTORY  Chief Complaint Chest Pain    HPI Helen Burgess is a 42 y.o. 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  She denies being abused.  She states that when she gets very anxious she picks her skin has been doing that for years.  She has been picking at her skin more and today had a panic attack.  She and her son are adamant that this was a panic attack.  They know the signs.  She is on medication for panic attacks, Lexapro, but is not helping.  She has an appoint with her primary care doctor in a few days to see if she can get help with her anxiety.  She has no SI or HI.  She states that her whole body was tingling and she felt very anxious but now she feels better.     Past Medical History:  Diagnosis Date  . ASD (atrial septal defect)   . Bowel obstruction (HCC)   . Hx SBO   . Occipital neuralgia     Patient Active Problem List   Diagnosis Date Noted  . Acute pancreatitis 03/28/2015    Past Surgical History:  Procedure Laterality Date  . ABDOMINAL SURGERY    . CHOLECYSTECTOMY    . 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 Authorizing Provider  acetaminophen (TYLENOL) 500 MG tablet Take 1,000 mg by mouth every 6 (six) hours as needed.    [provider]  albuterol (PROVENTIL HFA;VENTOLIN HFA) 108 (90 Base) MCG/ACT inhaler Inhale 2 puffs into the lungs every 4 (four) hours as needed. 05/14/17   Hassan RowanWade, Eugene, MD   baclofen (LIORESAL) 10 MG tablet Take 1 tablet (10 mg total) by mouth 3 (three) times daily. 04/20/15   Beers, Charmayne Sheerharles M, PA-C  benzonatate (TESSALON) 200 MG capsule Take 1 capsule (200 mg total) by mouth 3 (three) times daily as needed. 05/14/17   Hassan RowanWade, Eugene, MD  cephALEXin (KEFLEX) 500 MG capsule Take 1 capsule (500 mg total) by mouth 3 (three) times daily. 10/27/15   Payton Mccallumonty, Orlando, MD  cholecalciferol (VITAMIN D) 1000 units tablet Take 1,000 Units by mouth daily.    [provider]  cyclobenzaprine (FLEXERIL) 10 MG tablet Take 1 tablet (10 mg total) by mouth at bedtime. 05/02/15   Payton Mccallumonty, Orlando, MD  diazepam (VALIUM) 5 MG tablet Take 5 mg by mouth every 6 (six) hours as needed for anxiety.    [provider]  diclofenac (VOLTAREN) 75 MG EC tablet Take 1 tablet (75 mg total) by mouth 2 (two) times daily. 04/20/15   Beers, Charmayne Sheerharles M, PA-C  HYDROcodone-acetaminophen (NORCO/VICODIN) 5-325 MG per tablet Take 1-2 tablets by mouth every 6 (six) hours as needed. 04/07/15   Payton Mccallumonty, Orlando, MD  ibuprofen (ADVIL,MOTRIN) 600 MG tablet Take 600 mg by mouth every 6 (six) hours as needed.    [provider]  ketorolac (TORADOL) 10 MG tablet Take 1 tablet (10 mg total) by mouth every  8 (eight) hours as needed. For headache 05/02/15   [name], MD  levofloxacin (LEVAQUIN) 750 MG tablet Take 1 tablet (750 mg total) by mouth daily. 04/04/15   Shaune Pollack, MD  levofloxacin (LEVAQUIN) 750 MG tablet Take 1 tablet (750 mg total) by mouth daily. 05/14/17   Hassan Rowan, MD  lisinopril (PRINIVIL,ZESTRIL) 20 MG tablet Take 20 mg by mouth daily.    [provider]  oxyCODONE-acetaminophen (ROXICET) 5-325 MG per tablet Take 1-2 tablets by mouth every 4 (four) hours as needed for severe pain. 04/20/15   Beers, Charmayne Sheer, PA-C  pantoprazole (PROTONIX) 40 MG tablet Take 40 mg by mouth daily.    [provider]  predniSONE (STERAPRED UNI-PAK 21 TAB) 10 MG (21) TBPK tablet Sig 6 tablet  day 1, 5 tablets day 2, 4 tablets day 3,,3tablets day 4, 2 tablets day 5, 1 tablet day 6 take all tablets orally 05/14/17   Hassan Rowan, MD  pregabalin (LYRICA) 75 MG capsule Take 75 mg by mouth 2 (two) times daily.    [provider]  promethazine (PHENERGAN) 25 MG tablet Take 1 tablet (25 mg total) by mouth every 8 (eight) hours as needed for nausea or vomiting. 05/02/15   [name], MD  rizatriptan (MAXALT-MLT) 10 MG disintegrating tablet Take 1 tablet (10 mg total) by mouth as needed for migraine. May repeat in 2 hours if needed 10/27/15   [name], MD  ursodiol (ACTIGALL) 300 MG capsule Take 1 capsule (300 mg total) by mouth 2 (two) times daily. 04/04/15   Shaune Pollack, MD    Allergies Ultram [tramadol]  Family History  Problem Relation Age of Onset  . Diabetes Neg Hx     Social History Social History   Tobacco Use  . Smoking status: Never Smoker  . Smokeless tobacco: Never Used  Substance Use Topics  . Alcohol use: No  . Drug use: No    Review of Systems Constitutional: No fever/chills Eyes: No visual changes. ENT: No sore throat. No stiff neck no neck pain Cardiovascular: Denies chest pain. Respiratory: Denies shortness of breath. Gastrointestinal:   no vomiting.  No diarrhea.  No constipation. Genitourinary: Negative for dysuria. Musculoskeletal: Negative lower extremity swelling Skin: Negative for rash. Neurological: Negative for severe headaches, focal weakness or numbness.   ____________________________________________   PHYSICAL EXAM:  VITAL SIGNS: ED Triage Vitals [10/22/17 1319]  Enc Vitals Group     BP 108/74     Pulse Rate (!) 109     Resp 16     Temp 99.6 F (37.6 C)     Temp Source Oral     SpO2 98 %     Weight 177 lb (80.3 kg)     Height 5\' 7"  (1.702 m)     Head Circumference      Peak Flow      Pain Score 7     Pain Loc      Pain Edu?      Excl. in GC?     Constitutional: Alert and oriented. Well appearing and in no  acute distress. Eyes: Conjunctivae are normal Head: Atraumatic HEENT: No congestion/rhinnorhea. Mucous membranes are moist.  Oropharynx non-erythematous Neck:   Nontender with no meningismus, no masses, no stridor Cardiovascular: Normal rate, regular rhythm. Grossly normal heart sounds.  Good peripheral circulation. Respiratory: Normal respiratory effort.  No retractions. Lungs CTAB. Abdominal: Soft and nontender. No distention. No guarding no rebound Back:  There is no focal tenderness or step  off.  there is no midline tenderness there are no lesions noted. there is no CVA tenderness Musculoskeletal: No lower extremity tenderness, no upper extremity tenderness. No joint effusions, no DVT signs strong distal pulses no edema Neurologic:  Normal speech and language. No gross focal neurologic deficits are appreciated.  Skin:  Skin is warm, dry and intact.  Is no infection noted but there are no multiple areas where she can reach where she is obviously affected her skin.  Old scars from similar. Psychiatric: Mood and affect are anxious. Speech and behavior are normal.  ____________________________________________   LABS (all labs ordered are listed, but only abnormal results are displayed)  Labs Reviewed  BASIC METABOLIC PANEL - Abnormal; Notable for the following components:      Result Value   Glucose, Bld 118 (*)    All other components within normal limits  CBC - Abnormal; Notable for the following components:   WBC 12.7 (*)    Platelets 483 (*)    All other components within normal limits  TROPONIN I  POC URINE PREG, ED    Pertinent labs  results that were available during my care of the patient were reviewed by me and considered in my medical decision making (see chart for details). ____________________________________________  EKG  I personally interpreted any EKGs ordered by me or triage Sinus tach, rate 109 no acute ST elevation or depression nonspecific ST  changes, ____________________________________________  RADIOLOGY  Pertinent labs & imaging results that were available during my care of the patient were reviewed by me and considered in my medical decision making (see chart for details). If possible, patient and/or family made aware of any abnormal findings.  Dg Chest 2 View  Result Date: 10/22/2017 CLINICAL DATA:  Chest pain EXAM: CHEST - 2 VIEW COMPARISON:  05/14/2017 FINDINGS: Cardiomegaly. There is hyperinflation of the lungs compatible with COPD. Scarring at the right base. No confluent opacity on the left. No effusions. No acute bony abnormality. IMPRESSION: Cardiomegaly, COPD/chronic changes.  No active disease. Electronically Signed   By: Charlett Nose M.D.   On: 10/22/2017 14:00   ____________________________________________    PROCEDURES  Procedure(s) performed: None  Procedures  Critical Care performed: None  ____________________________________________   INITIAL IMPRESSION / ASSESSMENT AND PLAN / ED COURSE  Pertinent labs & imaging results that were available during my care of the patient were reviewed by me and considered in my medical decision making (see chart for details).  Here complaining that she had a panic attack.  I did offer her on and she refuses.  She understands that this limits my ability to work her up.  She states that she just needed time to relax.  She is with her son.  She is not being abused.  She understands the limitations placed upon me by her limiting the workup but is very comfortable with that as she knows a panic attack and feels it very somewhat that she had one.  Reassuringly, her cardiac enzymes are negative thus far, she is feeling less anxious at this time.  She would like to count to a counselor about outpatient counseling for her panic attacks.  She will also do this with her primary care she states.  Given the patient declines further care for me, understands limitations that she is placing  upon me in terms of my ability to diagnose other pathologies, and the fact that her signs and symptoms are all consistent with what she states is going on, which is  a panic attack and something very well known to her, we will see if she can benefit from talking to a counselor we will give her oral Ativan and we will see if we can refer her for outpatient treatment.    ____________________________________________   FINAL CLINICAL IMPRESSION(S) / ED DIAGNOSES  Final diagnoses:  None      This chart was dictated using voice recognition software.  Despite best efforts to proofread,  errors can occur which can change meaning.      Jeanmarie Plant, MD 10/22/17 [phone]

## 2017-10-22 NOTE — ED Notes (Signed)
TTS in to see pt  
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
## 2017-10-22 NOTE — Discharge Instructions (Signed)
You declined further workup for your symptoms today, which is your choice but does limit our ability to further evaluate you.  If you do change your mind or feel worse in any way, please return to the emergency room.  Please practice deep breathing and calming mechanisms if you start to feel anxious.  If you have chest pain or other concerns return to the emergency department.  Also with primary care in the next day or 2.

## 2018-01-05 ENCOUNTER — Emergency Department: Payer: 59

## 2018-01-05 ENCOUNTER — Encounter: Payer: Self-pay | Admitting: Emergency Medicine

## 2018-01-05 ENCOUNTER — Telehealth: Payer: Self-pay

## 2018-01-05 ENCOUNTER — Emergency Department
Admission: EM | Admit: 2018-01-05 | Discharge: 2018-01-05 | Disposition: A | Discharge status: Home / Self Care | Payer: 59 | Attending: Emergency Medicine | Admitting: Emergency Medicine

## 2018-01-05 DIAGNOSIS — R0600 Dyspnea, unspecified: Secondary | ICD-10-CM | POA: Insufficient documentation

## 2018-01-05 DIAGNOSIS — R609 Edema, unspecified: Secondary | ICD-10-CM | POA: Diagnosis present

## 2018-01-05 DIAGNOSIS — K5900 Constipation, unspecified: Secondary | ICD-10-CM | POA: Insufficient documentation

## 2018-01-05 HISTORY — DX: Migraine, unspecified, not intractable, without status migrainosus: G43.909

## 2018-01-05 HISTORY — DX: Factitial dermatitis: L98.1

## 2018-01-05 HISTORY — DX: Excoriation (skin-picking) disorder: F42.4

## 2018-01-05 LAB — COMPREHENSIVE METABOLIC PANEL
ALBUMIN: 4 g/dL (ref 3.5–5.0)
ALT: 22 U/L (ref 14–54)
ANION GAP: 6 (ref 5–15)
AST: 26 U/L (ref 15–41)
Alkaline Phosphatase: 62 U/L (ref 38–126)
BILIRUBIN TOTAL: 0.5 mg/dL (ref 0.3–1.2)
BUN: 12 mg/dL (ref 6–20)
CALCIUM: 8.8 mg/dL — AB (ref 8.9–10.3)
CO2: 29 mmol/L (ref 22–32)
Chloride: 102 mmol/L (ref 101–111)
Creatinine, Ser: 0.63 mg/dL (ref 0.44–1.00)
GFR calc Af Amer: >60 mL/min (ref >60)
GLUCOSE: 151 mg/dL — AB (ref 65–99)
POTASSIUM: 3.6 mmol/L (ref 3.5–5.1)
Sodium: 137 mmol/L (ref 135–145)
TOTAL PROTEIN: 7.7 g/dL (ref 6.5–8.1)

## 2018-01-05 LAB — URINALYSIS, COMPLETE (UACMP) WITH MICROSCOPIC
BACTERIA UA: NONE SEEN
BILIRUBIN URINE: NEGATIVE
GLUCOSE, UA: NEGATIVE mg/dL
Hgb urine dipstick: NEGATIVE
Ketones, ur: NEGATIVE mg/dL
LEUKOCYTES UA: NEGATIVE
NITRITE: NEGATIVE
PH: 7 (ref 5.0–8.0)
Protein, ur: NEGATIVE mg/dL
Specific Gravity, Urine: 1.003 — ABNORMAL LOW (ref 1.005–1.030)

## 2018-01-05 LAB — POCT PREGNANCY, URINE: PREG TEST UR: NEGATIVE

## 2018-01-05 LAB — CBC WITH DIFFERENTIAL/PLATELET
BASOS PCT: 1 %
Basophils Absolute: 0.1 10*3/uL (ref 0–0.1)
Eosinophils Absolute: 0.2 10*3/uL (ref 0–0.7)
Eosinophils Relative: 2 %
HEMATOCRIT: 38.2 % (ref 35.0–47.0)
HEMOGLOBIN: 12.8 g/dL (ref 12.0–16.0)
LYMPHS ABS: 3.1 10*3/uL (ref 1.0–3.6)
LYMPHS PCT: 29 %
MCH: 28.4 pg (ref 26.0–34.0)
MCHC: 33.6 g/dL (ref 32.0–36.0)
MCV: 84.5 fL (ref 80.0–100.0)
MONO ABS: 0.7 10*3/uL (ref 0.2–0.9)
MONOS PCT: 7 %
NEUTROS ABS: 6.8 10*3/uL — AB (ref 1.4–6.5)
NEUTROS PCT: 63 %
Platelets: 341 10*3/uL (ref 150–440)
RBC: 4.53 MIL/uL (ref 3.80–5.20)
RDW: 13.9 % (ref 11.5–14.5)
WBC: 10.9 10*3/uL (ref 3.6–11.0)

## 2018-01-05 LAB — LIPASE, BLOOD: LIPASE: 22 U/L (ref 11–51)

## 2018-01-05 LAB — BRAIN NATRIURETIC PEPTIDE: B Natriuretic Peptide: 44 pg/mL (ref 0.0–100.0)

## 2018-01-05 MED ORDER — HYDROMORPHONE HCL 1 MG/ML IJ SOLN
1.0000 mg | Freq: Once | INTRAMUSCULAR | Status: AC
  Administered 2018-01-05: 1 mg via INTRAVENOUS
  Filled 2018-01-05: qty 1

## 2018-01-05 MED ORDER — FUROSEMIDE 20 MG PO TABS: 20.0000 mg | ORAL_TABLET | Freq: Every day | ORAL | 0 refills | Status: DC

## 2018-01-05 NOTE — ED Triage Notes (Addendum)
Patient presents to ED via POV from home with c/o bilateral foot edema since the 15th of this month. Patient reports gaining 10 pounds in a week. Denies hx of CHF. Does report hx of open heart surgery due to a atrial septal defect. Patient also reports SOB and difficulty laying flat.

## 2018-01-05 NOTE — ED Provider Notes (Signed)
[facility] Emergency Department Provider Note       Time seen: ----------------------------------------- 7:39 AM on 01/05/2018 -----------------------------------------   I have reviewed the triage vital signs and the nursing notes.  HISTORY   Chief Complaint Leg Swelling    HPI Helen Burgess is a 42 y.o. female with a history of ASD, bowel obstruction, migraines who presents to the ED for peripheral edema.  Patient arrives by private vehicle from home with bilateral foot edema since the 15th of this month.  Patient reports getting around 10 pounds in the past week without a specific diagnosis of CHF.  Dyspnea is worse when she lays flat.  She does have a history of open heart surgery for an ASD.  Past Medical History:  Diagnosis Date  . ASD (atrial septal defect)   . Bowel obstruction (HCC)   . Hx SBO   . Migraines   . Occipital neuralgia     Patient Active Problem List   Diagnosis Date Noted  . Acute pancreatitis 03/28/2015    Past Surgical History:  Procedure Laterality Date  . ABDOMINAL SURGERY    . CHOLECYSTECTOMY    . 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 Smoking status: Never Smoker  . Smokeless tobacco: Never Used  Substance Use Topics  . Alcohol use: No  . Drug use: No   Review of Systems Constitutional: Negative for fever. Cardiovascular: Negative for chest pain. Respiratory: Positive for shortness of breath Gastrointestinal: Positive for abdominal pain, negative for vomiting diarrhea Musculoskeletal: Negative for back pain.  Positive for peripheral edema Skin: Negative for rash. Neurological: Negative for headaches, focal weakness or numbness.  All systems negative/normal/unremarkable except as stated in the HPI  ____________________________________________   PHYSICAL EXAM:  VITAL SIGNS: ED  Triage Vitals  Enc Vitals Group     BP 01/05/18 0730 (!) 147/98     Pulse Rate 01/05/18 0730 92     Resp 01/05/18 0730 16     Temp 01/05/18 0730 98 F (36.7 C)     Temp Source 01/05/18 0730 Oral     SpO2 01/05/18 0730 96 %     Weight 01/05/18 0732 193 lb (87.5 kg)     Height 01/05/18 0732  (1.702 m)     Head Circumference --      Peak Flow --      Pain Score 01/05/18 0731 8     Pain Loc --      Pain Edu? --      Excl. in GC? --    Constitutional: Alert and oriented. Well appearing and in no distress. Eyes: Conjunctivae are normal. Normal extraocular movements. ENT   Head: Normocephalic and atraumatic.   Nose: No congestion/rhinnorhea.   Mouth/Throat: Mucous membranes are moist.   Neck: No stridor. Cardiovascular: Normal rate, regular rhythm. No murmurs, rubs, or gallops. Respiratory: Normal respiratory effort without tachypnea nor retractions. Breath sounds are clear and equal bilaterally. No wheezes/rales/rhonchi. Gastrointestinal: Soft with nonfocal abdominal tenderness.  Normal bowel sounds. Musculoskeletal: Nontender with normal range of motion in extremities.  Bilateral edema is noted, left greater than right Neurologic:  Normal speech and language. No gross focal neurologic deficits are appreciated.  Skin:  Skin is warm, dry and intact.  Psychiatric: Mood and affect are normal. Speech and behavior are normal.  ____________________________________________  EKG: Interpreted  by me.  Sinus rhythm the rate of 84 bpm, normal PR interval, normal QRS size, normal QT.  Nonspecific T wave inversions  ____________________________________________  ED COURSE:  As part of my medical decision making, I reviewed the following data within the electronic MEDICAL RECORD NUMBER History obtained from family if available, nursing notes, old chart and ekg, as well as notes from prior ED visits. Patient presented for peripheral edema, we will assess with labs and imaging as  indicated at this time.   Procedures ____________________________________________   LABS (pertinent positives/negatives)  Labs Reviewed  CBC WITH DIFFERENTIAL/PLATELET - Abnormal; Notable for the following components:      Result Value   Neutro Abs 6.8 (*)    All other components within normal limits  COMPREHENSIVE METABOLIC PANEL - Abnormal; Notable for the following components:   Glucose, Bld 151 (*)    Calcium 8.8 (*)    All other components within normal limits  URINALYSIS, COMPLETE (UACMP) WITH MICROSCOPIC - Abnormal; Notable for the following components:   Color, Urine STRAW (*)    APPearance CLEAR (*)    Specific Gravity, Urine 1.003 (*)    All other components within normal limits  LIPASE, BLOOD  BRAIN NATRIURETIC PEPTIDE  POCT PREGNANCY, URINE    RADIOLOGY Images were viewed by me  Acute abdominal series IMPRESSION: No bowel obstruction or free air evident.  Moderate stool in colon.  Air noted in the esophagus. Patient has had previous bowel interposition within the chest.  Stable cardiomegaly with mild pulmonary vascular congestion. Mild scarring in the lung bases. No frank edema or consolidation. ____________________________________________  DIFFERENTIAL DIAGNOSIS   CHF, peripheral edema, pancreatitis, cirrhosis  FINAL ASSESSMENT AND PLAN  Peripheral edema, constipation   Plan: The patient had presented for edema and weight gain with shortness of breath. Patient's labs are reassuring today. Patient's imaging did reveal stable cardiomegaly with mild congestion.  Her peripheral edema may be due to restarting Lamictal recently.  I will place her on low-dose Lasix and refer her to the heart failure clinic as she likely needs to be established with them anyway due to her cardiomegaly.  She will also stop Lamictal for the time being.  She is cleared for outpatient follow-up.   Ulice Dash, MD   Note: This note was generated in part or whole  with voice recognition software. Voice recognition is usually quite accurate but there are transcription errors that can and very often do occur. I apologize for any typographical errors that were not detected and corrected.     Emily Filbert, MD 01/05/18 [phone]

## 2018-01-05 NOTE — Telephone Encounter (Signed)
LM to contact to office to make appointment with clinic per ED visit.

## 2018-01-05 NOTE — ED Notes (Signed)
Patient's discharge and follow up information reviewed with patient by ED nursing staff and patient given the opportunity to ask questions pertaining to ED visit and discharge plan of care. Patient advised that should symptoms not continue to improve, resolve entirely, or should new symptoms develop then a follow up visit with their PCP or a return visit to the ED may be warranted. Patient verbalized consent and understanding of discharge plan of care including potential need for further evaluation. Patient discharged in stable condition per attending ED physician on duty.   Pt leaving with friend who is driving.

## 2018-01-06 ENCOUNTER — Encounter: Payer: Self-pay | Admitting: Family

## 2018-01-06 ENCOUNTER — Ambulatory Visit: Payer: 59 | Attending: Family | Admitting: Family

## 2018-01-06 VITALS — BP 140/94 | HR 114 | Resp 18 | Ht 67.0 in | Wt 188.5 lb

## 2018-01-06 DIAGNOSIS — Z79891 Long term (current) use of opiate analgesic: Secondary | ICD-10-CM | POA: Insufficient documentation

## 2018-01-06 DIAGNOSIS — Z79899 Other long term (current) drug therapy: Secondary | ICD-10-CM | POA: Insufficient documentation

## 2018-01-06 DIAGNOSIS — Q211 Atrial septal defect: Secondary | ICD-10-CM | POA: Insufficient documentation

## 2018-01-06 DIAGNOSIS — I110 Hypertensive heart disease with heart failure: Secondary | ICD-10-CM | POA: Diagnosis present

## 2018-01-06 DIAGNOSIS — Z886 Allergy status to analgesic agent status: Secondary | ICD-10-CM | POA: Diagnosis not present

## 2018-01-06 DIAGNOSIS — F329 Major depressive disorder, single episode, unspecified: Secondary | ICD-10-CM | POA: Diagnosis not present

## 2018-01-06 DIAGNOSIS — F410 Panic disorder [episodic paroxysmal anxiety] without agoraphobia: Secondary | ICD-10-CM | POA: Diagnosis not present

## 2018-01-06 DIAGNOSIS — R002 Palpitations: Secondary | ICD-10-CM | POA: Insufficient documentation

## 2018-01-06 DIAGNOSIS — I5032 Chronic diastolic (congestive) heart failure: Secondary | ICD-10-CM | POA: Diagnosis not present

## 2018-01-06 DIAGNOSIS — I10 Essential (primary) hypertension: Secondary | ICD-10-CM | POA: Insufficient documentation

## 2018-01-06 DIAGNOSIS — R0602 Shortness of breath: Secondary | ICD-10-CM | POA: Diagnosis not present

## 2018-01-06 DIAGNOSIS — R079 Chest pain, unspecified: Secondary | ICD-10-CM | POA: Diagnosis not present

## 2018-01-06 DIAGNOSIS — Z888 Allergy status to other drugs, medicaments and biological substances status: Secondary | ICD-10-CM | POA: Insufficient documentation

## 2018-01-06 DIAGNOSIS — R42 Dizziness and giddiness: Secondary | ICD-10-CM | POA: Diagnosis not present

## 2018-01-06 DIAGNOSIS — Z9049 Acquired absence of other specified parts of digestive tract: Secondary | ICD-10-CM | POA: Diagnosis not present

## 2018-01-06 MED ORDER — FUROSEMIDE 20 MG PO TABS: 20.0000 mg | ORAL_TABLET | Freq: Every day | ORAL | 5 refills | Status: DC

## 2018-01-06 NOTE — Patient Instructions (Addendum)
Continue weighing daily and call for an overnight weight gain of > 2 pounds or a weekly weight gain of >5 pounds.  Keep fluid intake to 60-65 ounces daily.

## 2018-01-06 NOTE — Progress Notes (Signed)
Patient ID: Helen Burgess, female    DOB: [birth year], 42 y.o.   MRN: [id number]  HPI  Ms Frank is a 42 y/o female with a history of HTN, panic attacks, migraines, atrial septal defect and chronic heart failure.   Echo report from 03/30/15 reviewed and showed an EF of 55-65%  Was in the ED 01/05/18 due to pedal edema where she was treated with lasix and released. She was in the ED 10/22/17 due to atypical chest pain and panic attack where she was evaluated and released.   She presents today for her initial visit with a chief complaint of moderate fatigue upon minimal exertion. She describes this as chronic in nature having been present for several months. She has associated shortness of breath, light-headedness, intermittent chest pain, pedal edema, palpitations, abdominal distention, depression, anxiety and difficulty sleeping along with this. She denies a cough.   Past Medical History:  Diagnosis Date  . ASD (atrial septal defect)   . Bowel obstruction (HCC)   . CHF (congestive heart failure) (HCC)   . Dermatillomania   . Hx SBO   . Migraines   . Occipital neuralgia   . Panic attacks    Past Surgical History:  Procedure Laterality Date  . ABDOMINAL SURGERY    . CHOLECYSTECTOMY    . 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 Diabetes Neg Hx    Social History   Tobacco Use  . Smoking status: Never Smoker  . Smokeless tobacco: Never Used  Substance Use Topics  . Alcohol use: No   Allergies  Allergen Reactions  . Nsaids Other (See Comments)    Pancreatitis.  Marland Kitchen Ultram [Tramadol] Itching   Prior to Admission medications   Medication Sig Start Date End Date Taking? Authorizing Provider  AJOVY 225 MG/1.5ML SOSY Take 1 mL by mouth every 30 (thirty) days. 12/22/17  Yes [provider]  cyclobenzaprine (FLEXERIL) 10 MG tablet Take 1 tablet (10 mg total) by mouth at bedtime. Patient  taking differently: Take 10 mg by mouth 3 (three) times daily.  05/02/15  Yes [name], MD  diazepam (VALIUM) 5 MG tablet Take 5 mg by mouth at bedtime.    Yes [provider]  furosemide (LASIX) 20 MG tablet Take 1 tablet (20 mg total) by mouth daily. 01/06/18 01/06/19 Yes Kyi Romanello, Inetta Fermo A, FNP  gabapentin (NEURONTIN) 300 MG capsule Take 300 mg by mouth 2 (two) times daily.   Yes [provider]  oxyCODONE-acetaminophen (PERCOCET) 10-325 MG tablet Take 1 tablet by mouth every 4 (four) hours as needed for pain. Take 1 tablet by mouth every 4 (four)- 6(six) hours as needed for pain. 12/23/17  Yes [provider]  rizatriptan (MAXALT-MLT) 10 MG disintegrating tablet Take 1 tablet (10 mg total) by mouth as needed for migraine. May repeat in 2 hours if needed Patient taking differently: Take 5 mg by mouth as needed for migraine. May repeat in 2 hours if needed 10/27/15  Yes [name], MD    Review of Systems  Constitutional: Positive for fatigue (tire easily).  Eyes: Negative.   Respiratory: Positive for shortness of breath (moderate exertion). Negative for chest tightness.   Cardiovascular: Positive for chest pain (with stress), palpitations and leg swelling ("better").  Gastrointestinal: Positive for abdominal distention ("improving"). Negative for abdominal pain.  Endocrine: Negative.   Genitourinary: Negative.   Musculoskeletal:  Positive for back pain (chronic) and neck pain.  Skin: Negative.   Allergic/Immunologic: Negative.   Neurological: Positive for light-headedness (with panic attacks) and headaches (migraine). Negative for dizziness.  Hematological: Negative for adenopathy. Does not bruise/bleed easily.  Psychiatric/Behavioral: Positive for dysphoric mood (sees [place]) and sleep disturbance (not sleeping well last few months). The patient is nervous/anxious.     Vitals:   01/06/18 1215  BP: (!) 140/94  Pulse: (!) 114  Resp: 18   SpO2: 95%  Weight: 188 lb 8 oz (85.5 kg)  Height:  (1.702 m)   Wt Readings from Last 3 Encounters:  01/06/18 188 lb 8 oz (85.5 kg)  01/05/18 193 lb (87.5 kg)  10/22/17 177 lb (80.3 kg)   Lab Results  Component Value Date   CREATININE 0.63 01/05/2018   CREATININE 0.56 10/22/2017   CREATININE 0.78 04/07/2015   Physical Exam  Constitutional: She is oriented to person, place, and time. She appears well-developed and well-nourished.  HENT:  Head: Normocephalic and atraumatic.  Neck: Normal range of motion. Neck supple. No JVD present.  Cardiovascular: Regular rhythm. Tachycardia present.  Pulmonary/Chest: Effort normal. No respiratory distress. She has no wheezes. She has no rales.  Abdominal: Soft. She exhibits no distension.  Musculoskeletal:       Right lower leg: She exhibits edema (1+ pitting edema). She exhibits no tenderness.       Left lower leg: She exhibits edema (1+ pitting edema). She exhibits no tenderness.  Neurological: She is alert and oriented to person, place, and time.  Skin: Skin is warm and dry.  Psychiatric: She has a normal mood and affect. Her behavior is normal.  Nursing note and vitals reviewed.  Assessment & Plan:  1: Chronic heart failure with preserved ejection fraction- - NYHA class III - mildly fluid overloaded with pedal edema although she says it is improving since furosemide was started yesterday - weighing daily and she says that her weight has declined. Instructed to call for an overnight weight gain of >2 pounds or a weekly weight gain of >5 pounds - not adding salt to her food and has been reading food labels. Reviewed the importance of closely following a  sodium diet and written dietary information was given to her about this.  - will get echo scheduled - will get a BMP at her next visit since she's on furosemide and no potassium - BNP 01/05/18 was 44.0 - had ASD repaired at the age of 88 so will probably need to get established  with cardiology  2: HTN-  - BP mildly elevated today - saw PCP Hyacinth Meeker) 11/30/16 but may not return as she may find a new PCP - BMP 01/05/18 reviewed and showed sodium 137, potassium 3.6 and GFR >60  3: Panic attacks- - HR elevated today; could be due to anxiety - may need to add beta-blocker to slow her HR but want to get her edema improved some more first.  Patient did not bring her medications nor a list. Each medication was verbally reviewed with the patient and she was encouraged to bring the bottles to every visit to confirm accuracy of list.  Return in 2 weeks or sooner for any questions/problems before then.

## 2018-01-18 ENCOUNTER — Ambulatory Visit: Payer: 59 | Attending: Family | Admitting: Family

## 2018-01-18 ENCOUNTER — Encounter: Payer: Self-pay | Admitting: Family

## 2018-01-18 ENCOUNTER — Ambulatory Visit
Admission: RE | Admit: 2018-01-18 | Discharge: 2018-01-18 | Disposition: A | Discharge status: Home / Self Care | Payer: 59 | Source: Ambulatory Visit | Attending: Family | Admitting: Family

## 2018-01-18 VITALS — BP 141/92 | HR 87 | Resp 18 | Ht 67.0 in | Wt 185.5 lb

## 2018-01-18 DIAGNOSIS — R5383 Other fatigue: Secondary | ICD-10-CM | POA: Diagnosis present

## 2018-01-18 DIAGNOSIS — I110 Hypertensive heart disease with heart failure: Secondary | ICD-10-CM | POA: Diagnosis not present

## 2018-01-18 DIAGNOSIS — Z79899 Other long term (current) drug therapy: Secondary | ICD-10-CM | POA: Insufficient documentation

## 2018-01-18 DIAGNOSIS — Z9049 Acquired absence of other specified parts of digestive tract: Secondary | ICD-10-CM | POA: Insufficient documentation

## 2018-01-18 DIAGNOSIS — I5032 Chronic diastolic (congestive) heart failure: Secondary | ICD-10-CM | POA: Insufficient documentation

## 2018-01-18 DIAGNOSIS — Q211 Atrial septal defect: Secondary | ICD-10-CM | POA: Insufficient documentation

## 2018-01-18 DIAGNOSIS — Z888 Allergy status to other drugs, medicaments and biological substances status: Secondary | ICD-10-CM | POA: Insufficient documentation

## 2018-01-18 DIAGNOSIS — F410 Panic disorder [episodic paroxysmal anxiety] without agoraphobia: Secondary | ICD-10-CM | POA: Insufficient documentation

## 2018-01-18 DIAGNOSIS — Z886 Allergy status to analgesic agent status: Secondary | ICD-10-CM | POA: Insufficient documentation

## 2018-01-18 DIAGNOSIS — I10 Essential (primary) hypertension: Secondary | ICD-10-CM

## 2018-01-18 LAB — BASIC METABOLIC PANEL WITH GFR
Anion gap: 10 (ref 5–15)
BUN: 7 mg/dL (ref 6–20)
CO2: 26 mmol/L (ref 22–32)
Calcium: 9.2 mg/dL (ref 8.9–10.3)
Chloride: 101 mmol/L (ref 101–111)
Creatinine, Ser: 0.64 mg/dL (ref 0.44–1.00)
GFR calc Af Amer: >60 mL/min (ref >60)
GFR calc non Af Amer: >60 mL/min (ref >60)
Glucose, Bld: 114 mg/dL — ABNORMAL HIGH (ref 65–99)
Potassium: 3.7 mmol/L (ref 3.5–5.1)
Sodium: 137 mmol/L (ref 135–145)

## 2018-01-18 NOTE — Progress Notes (Signed)
Patient ID: Helen Burgess, female    DOB: 1975/11/18, 42 y.o.   MRN: [id number]  HPI  [name] is Burgess 42 y/o female with Burgess history of HTN, panic attacks, migraines, atrial septal defect and chronic heart failure.   Echo report from 03/30/15 reviewed and showed an EF of 55-65%.   Was in the ED 01/05/18 due to pedal edema where she was treated with lasix and released. She was in the ED 10/22/17 due to atypical chest pain and panic attack where she was evaluated and released.   She presents today for her follow-up with Burgess chief complaint of moderate fatigue upon minimal exertion. She describes this as having been present for Burgess few months. She doesn't feel like it's any worse or better. She has associated shortness of breath, pedal edema, palpitations (occasionally), abdominal distention (improving), light-headedness, headaches, difficulty sleeping and anxiety along with this. She denies any chest pain, cough or weight gain.   Past Medical History:  Diagnosis Date  . ASD (atrial septal defect)   . Bowel obstruction (HCC)   . CHF (congestive heart failure) (HCC)   . Dermatillomania   . Hx SBO   . Migraines   . Occipital neuralgia   . Panic attacks    Past Surgical History:  Procedure Laterality Date  . ABDOMINAL SURGERY    . CHOLECYSTECTOMY    . 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 Diabetes Neg Hx    Social History   Tobacco Use  . Smoking status: Never Smoker  . Smokeless tobacco: Never Used  Substance Use Topics  . Alcohol use: No   Allergies  Allergen Reactions  . Nsaids Other (See Comments)    Pancreatitis.  Marland Kitchen Ultram [Tramadol] Itching   Prior to Admission medications   Medication Sig Start Date End Date Taking? Authorizing Provider  AJOVY 225 MG/1.5ML SOSY Take 1 mL by mouth every 30 (thirty) days. 12/22/17  Yes [provider]  cyclobenzaprine (FLEXERIL) 10 MG tablet Take  1 tablet (10 mg total) by mouth at bedtime. Patient taking differently: Take 10 mg by mouth 3 (three) times daily.  05/02/15  Yes Helen Mccallum, MD  diazepam (VALIUM) 5 MG tablet Take 5 mg by mouth at bedtime.    Yes [provider]  furosemide (LASIX) 20 MG tablet Take 1 tablet (20 mg total) by mouth daily. 01/06/18 01/06/19 Yes Helen Burgess, Helen Fermo A, FNP  gabapentin (NEURONTIN) 300 MG capsule Take 300 mg by mouth 2 (two) times daily.   Yes [provider]  oxyCODONE-acetaminophen (PERCOCET) 10-325 MG tablet Take 1 tablet by mouth every 4 (four) hours as needed for pain. Take 1 tablet by mouth every 4 (four)- 6(six) hours as needed for pain. 12/23/17  Yes [provider]  rizatriptan (MAXALT-MLT) 10 MG disintegrating tablet Take 1 tablet (10 mg total) by mouth as needed for migraine. May repeat in 2 hours if needed Patient taking differently: Take 5 mg by mouth as needed for migraine. May repeat in 2 hours if needed 10/27/15  Yes Helen Mccallum, MD    Review of Systems  Constitutional: Positive for fatigue. Negative for appetite change.  HENT: Negative for congestion, postnasal drip and sore throat.   Eyes: Negative.   Respiratory: Positive for shortness of breath. Negative for cough and chest tightness.   Cardiovascular: Positive for palpitations and leg swelling ("better"). Negative  for chest pain.  Gastrointestinal: Positive for abdominal distention ("improving"). Negative for abdominal pain.  Endocrine: Negative.   Genitourinary: Negative.   Musculoskeletal: Positive for back pain (chronic) and neck pain.  Skin: Negative.   Allergic/Immunologic: Negative.   Neurological: Positive for light-headedness (with panic attacks) and headaches (migraine). Negative for dizziness.  Hematological: Negative for adenopathy. Does not bruise/bleed easily.  Psychiatric/Behavioral: Positive for dysphoric mood (sees [facility]) and sleep disturbance (not sleeping well last  few months). The patient is nervous/anxious.     Vitals:   01/18/18 1409  BP: (!) 141/92  Pulse: 87  Resp: 18  SpO2: 99%  Weight: 185 lb 8 oz (84.1 kg)  Height: 5\' 7"  (1.702 m)   Wt Readings from Last 3 Encounters:  01/18/18 185 lb 8 oz (84.1 kg)  01/06/18 188 lb 8 oz (85.5 kg)  01/05/18 193 lb (87.5 kg)   Lab Results  Component Value Date   CREATININE 0.63 01/05/2018   CREATININE 0.56 10/22/2017   CREATININE 0.78 04/07/2015   Physical Exam  Constitutional: She is oriented to person, place, and time. She appears well-developed and well-nourished.  HENT:  Head: Normocephalic and atraumatic.  Neck: Normal range of motion. Neck supple. No JVD present.  Cardiovascular: Normal rate and regular rhythm.  Pulmonary/Chest: Effort normal. No respiratory distress. She has no wheezes. She has no rales.  Abdominal: Soft. She exhibits no distension.  Musculoskeletal:       Right lower leg: She exhibits edema (1+ pitting edema). She exhibits no tenderness.       Left lower leg: She exhibits edema (1+ pitting edema). She exhibits no tenderness.  Neurological: She is alert and oriented to person, place, and time.  Skin: Skin is warm and dry.  Psychiatric: She has Burgess normal mood and affect. Her behavior is normal.  Nursing note and vitals reviewed.  Assessment & Plan:  1: Chronic heart failure with preserved ejection fraction- - NYHA class III - mildly fluid overloaded with continued pedal edema - weighing daily and she says that her weight has declined. Instructed to call for an overnight weight gain of >2 pounds or Burgess weekly weight gain of >5 pounds - weight down ~ 3 pounds since she was last here - not adding salt to her food and has been reading food labels. Reviewed the importance of closely following Burgess 2000mg  sodium diet - had echocardiogram done earlier today - will get Burgess BMP today; depending on results may increase furosemide to 40mg  daily - BNP 01/05/18 was 44.0 - had ASD  repaired at the age of 42 so will probably need to get established with cardiology  2: HTN-  - BP mildly elevated today - saw PCP Helen Burgess([facility]) 11/30/16 but may not return as she may find Burgess new PCP - BMP 01/05/18 reviewed and showed sodium 137, potassium 3.6 and GFR >60  3: Panic attacks- - follows with behavioral health  Patient did not bring her medications nor Burgess list. Each medication was verbally reviewed with the patient and she was encouraged to bring the bottles to every visit to confirm accuracy of list.  Return in 1 month or sooner for any questions/problems before then.

## 2018-01-18 NOTE — Progress Notes (Signed)
*  PRELIMINARY RESULTS* Echocardiogram 2D Echocardiogram has been performed.  Joanette GulaJoan M Anh Bigos 01/18/2018, 11:53 AM

## 2018-01-18 NOTE — Patient Instructions (Signed)
Continue weighing daily and call for an overnight weight gain of > 2 pounds or a weekly weight gain of >5 pounds. 

## 2018-01-19 ENCOUNTER — Telehealth: Payer: Self-pay | Admitting: Family

## 2018-01-19 MED ORDER — POTASSIUM CHLORIDE CRYS ER 20 MEQ PO TBCR
20.0000 meq | EXTENDED_RELEASE_TABLET | Freq: Every day | ORAL | 3 refills | Status: DC
Start: 2018-01-19 — End: 2018-02-14

## 2018-01-19 MED ORDER — FUROSEMIDE 40 MG PO TABS: 40.0000 mg | ORAL_TABLET | Freq: Every day | ORAL | 3 refills | Status: DC

## 2018-01-19 NOTE — Telephone Encounter (Signed)
Spoke with patient regarding lab results and echocardiogram results from 01/18/18. Renal function and potassium level looks good.   Patient continues to have edema so will increase her furosemide to 40mg  daily along with potassium 20meq daily. Advised patient to finish her current furosemide bottle by taking 2 tablets daily until it's gone and then begin the 40mg  dose. Will send in new RX for the 40mg  dose along with potassium 20meq.   Also advised patient of echo results which has a normal EF. Patient has had an ASD repair at the age of 42 and hasn't seen cardiology in decades. She is agreeable to cardiology appointment to get established with someone. [facility]

## 2018-02-13 ENCOUNTER — Ambulatory Visit: Payer: 59 | Attending: Family | Admitting: Family

## 2018-02-13 ENCOUNTER — Encounter: Payer: Self-pay | Admitting: Family

## 2018-02-13 VITALS — BP 131/95 | HR 107 | Resp 18 | Ht 67.0 in | Wt 183.1 lb

## 2018-02-13 DIAGNOSIS — I10 Essential (primary) hypertension: Secondary | ICD-10-CM

## 2018-02-13 DIAGNOSIS — G43909 Migraine, unspecified, not intractable, without status migrainosus: Secondary | ICD-10-CM | POA: Insufficient documentation

## 2018-02-13 DIAGNOSIS — M549 Dorsalgia, unspecified: Secondary | ICD-10-CM | POA: Diagnosis not present

## 2018-02-13 DIAGNOSIS — Q211 Atrial septal defect: Secondary | ICD-10-CM | POA: Insufficient documentation

## 2018-02-13 DIAGNOSIS — G8929 Other chronic pain: Secondary | ICD-10-CM | POA: Insufficient documentation

## 2018-02-13 DIAGNOSIS — I110 Hypertensive heart disease with heart failure: Secondary | ICD-10-CM | POA: Diagnosis not present

## 2018-02-13 DIAGNOSIS — F410 Panic disorder [episodic paroxysmal anxiety] without agoraphobia: Secondary | ICD-10-CM | POA: Diagnosis not present

## 2018-02-13 DIAGNOSIS — I5032 Chronic diastolic (congestive) heart failure: Secondary | ICD-10-CM

## 2018-02-13 DIAGNOSIS — Z79899 Other long term (current) drug therapy: Secondary | ICD-10-CM | POA: Diagnosis not present

## 2018-02-13 DIAGNOSIS — R000 Tachycardia, unspecified: Secondary | ICD-10-CM | POA: Insufficient documentation

## 2018-02-13 DIAGNOSIS — I509 Heart failure, unspecified: Secondary | ICD-10-CM | POA: Diagnosis present

## 2018-02-13 LAB — BASIC METABOLIC PANEL
Anion gap: 11 (ref 5–15)
BUN: 6 mg/dL (ref 6–20)
CO2: 28 mmol/L (ref 22–32)
CREATININE: 0.66 mg/dL (ref 0.44–1.00)
Calcium: 9 mg/dL (ref 8.9–10.3)
Chloride: 97 mmol/L — ABNORMAL LOW (ref 98–111)
GFR calc Af Amer: >60 mL/min (ref >60)
Glucose, Bld: 142 mg/dL — ABNORMAL HIGH (ref 70–99)
Potassium: 3 mmol/L — ABNORMAL LOW (ref 3.5–5.1)
SODIUM: 136 mmol/L (ref 135–145)

## 2018-02-13 MED ORDER — METOPROLOL SUCCINATE ER 25 MG PO TB24: 25.0000 mg | ORAL_TABLET | Freq: Every day | ORAL | 3 refills | Status: DC

## 2018-02-13 NOTE — Patient Instructions (Signed)
Continue weighing daily and call for an overnight weight gain of > 2 pounds or a weekly weight gain of >5 pounds. 

## 2018-02-13 NOTE — Progress Notes (Signed)
Patient ID: Helen Burgess, female    DOB: 01-23-1976, 42 y.o.   MRN: [id number]  HPI  Helen Burgess is Burgess 42 y/o female with Burgess history of HTN, panic attacks, migraines, atrial septal defect and chronic heart failure.   Echo report from 01/18/18 reviewed and showed an EF of 55-65%. Echo report from 03/30/15 reviewed and showed an EF of 55-65%.   Was in the ED 01/05/18 due to pedal edema where she was treated with lasix and released. She was in the ED 10/22/17 due to atypical chest pain and panic attack where she was evaluated and released.   She presents today for her follow-up visit with Burgess chief complaint of minimal shortness of breath upon moderate exertion. She says this has been chronic in nature and seems to be improving some. She has associated fatigue, abdominal distention, chronic back pain and anxiety along with this. She denies any difficulty sleeping, palpitations, pedal edema, chest pain, cough, dizziness or weight gain. Has had 3 sleep studies in the past and ruled out for sleep apnea each time.   Past Medical History:  Diagnosis Date  . ASD (atrial septal defect)   . Bowel obstruction (HCC)   . CHF (congestive heart failure) (HCC)   . Dermatillomania   . Hx SBO   . Migraines   . Occipital neuralgia   . Panic attacks    Past Surgical History:  Procedure Laterality Date  . ABDOMINAL SURGERY    . CHOLECYSTECTOMY    . 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 Diabetes Neg Hx    Social History   Tobacco Use  . Smoking status: Never Smoker  . Smokeless tobacco: Never Used  Substance Use Topics  . Alcohol use: No   Allergies  Allergen Reactions  . Nsaids Other (See Comments)    Pancreatitis.  Marland Kitchen Ultram [Tramadol] Itching   Prior to Admission medications   Medication Sig Start Date End Date Taking? Authorizing Provider  AJOVY 225 MG/1.5ML SOSY Take 1 mL by mouth every 30 (thirty) days.  12/22/17  Yes [provider]  cyclobenzaprine (FLEXERIL) 10 MG tablet Take 1 tablet (10 mg total) by mouth at bedtime. Patient taking differently: Take 10 mg by mouth 3 (three) times daily.  05/02/15  Yes Helen Mccallum, MD  diazepam (VALIUM) 5 MG tablet Take 5 mg by mouth at bedtime.    Yes [provider]  escitalopram (LEXAPRO) 5 MG tablet Take 5 mg by mouth daily.   Yes [provider]  furosemide (LASIX) 40 MG tablet Take 1 tablet (40 mg total) by mouth daily. 01/19/18 04/19/18 Yes Helen Burgess, Helen Fermo A, FNP  gabapentin (NEURONTIN) 300 MG capsule Take 300 mg by mouth 2 (two) times daily.   Yes [provider]  oxyCODONE ER (XTAMPZA ER) 18 MG C12A Take 1 tablet by mouth 2 (two) times daily.   Yes [provider]  oxyCODONE-acetaminophen (PERCOCET) 10-325 MG tablet Take 1 tablet by mouth every 4 (four) hours as needed for pain. Take 1 tablet by mouth every 4 (four)- 6(six) hours as needed for pain. 12/23/17  Yes [provider]  potassium chloride SA (K-DUR,KLOR-CON) 20 MEQ tablet Take 1 tablet (20 mEq total) by mouth daily. 01/19/18  Yes Helen Burgess A, FNP  rizatriptan (MAXALT-MLT) 10 MG disintegrating tablet Take 1 tablet (10 mg total) by mouth as needed for  migraine. May repeat in 2 hours if needed Patient taking differently: Take 5 mg by mouth as needed for migraine. May repeat in 2 hours if needed 10/27/15  Yes Helen Mccallum, MD    Review of Systems  Constitutional: Positive for fatigue. Negative for appetite change.  HENT: Negative for congestion, postnasal drip and sore throat.   Eyes: Negative.   Respiratory: Positive for shortness of breath (with moderate exertion). Negative for cough and chest tightness.   Cardiovascular: Negative for chest pain, palpitations and leg swelling.  Gastrointestinal: Positive for abdominal distention ("improving"). Negative for abdominal pain.  Endocrine: Negative.   Genitourinary: Negative.   Musculoskeletal:  Positive for back pain (chronic) and neck pain.  Skin: Negative.   Allergic/Immunologic: Negative.   Neurological: Negative for dizziness, light-headedness and headaches.  Hematological: Negative for adenopathy. Does not bruise/bleed easily.  Psychiatric/Behavioral: Positive for dysphoric mood (sees [place]). Negative for sleep disturbance. The patient is nervous/anxious.    Vitals:   02/13/18 1456  BP: (!) 131/95  Pulse: (!) 107  Resp: 18  SpO2: 96%  Weight: 183 lb 2 oz (83.1 kg)  Height: 5\' 7"  (1.702 m)   Wt Readings from Last 3 Encounters:  02/13/18 183 lb 2 oz (83.1 kg)  01/18/18 185 lb 8 oz (84.1 kg)  01/06/18 188 lb 8 oz (85.5 kg)   Lab Results  Component Value Date   CREATININE 0.64 01/18/2018   CREATININE 0.63 01/05/2018   CREATININE 0.56 10/22/2017   Physical Exam  Constitutional: She is oriented to person, place, and time. She appears well-developed and well-nourished.  HENT:  Head: Normocephalic and atraumatic.  Neck: Normal range of motion. Neck supple. No JVD present.  Cardiovascular: Normal rate and regular rhythm.  Pulmonary/Chest: Effort normal. No respiratory distress. She has no wheezes. She has no rales.  Abdominal: Soft. She exhibits no distension.  Musculoskeletal: She exhibits no tenderness.       Right lower leg: She exhibits no tenderness.       Left lower leg: She exhibits no tenderness.  Neurological: She is alert and oriented to person, place, and time.  Skin: Skin is warm and dry.  Psychiatric: She has Burgess normal mood and affect. Her behavior is normal.  Nursing note and vitals reviewed.  Assessment & Plan:  1: Chronic heart failure with preserved ejection fraction- - NYHA class II - euvolemic today - weighing daily and she says that her weight has declined. Reminded to call for an overnight weight gain of >2 pounds or Burgess weekly weight gain of >5 pounds - weight down 2 pounds since she was last here ~ 1 month ago - not  adding salt to her food and has been reading food labels. Reviewed the importance of closely following Burgess 2000mg  sodium diet - will get BMP today since furosemide increased and potassium was started - has appointment with cardiology ([facility]) 03/07/18 - had ASD repaired at the age of 39  - BNP 01/05/18 was 44.0  2: HTN-  - BP mildly elevated today - saw PCP Hyacinth Meeker) 11/30/16 but may not return as she may find Burgess new PCP - BMP 01/18/18 reviewed and showed sodium 137, potassium 3.7 and GFR >60  3: Panic attacks- - tachycardic today and has been tachycardic on previous occasions - will start metoprolol succinate 25mg  daily - follows with behavioral health  Patient did not bring her medications nor Burgess list. Each medication was verbally reviewed with the patient and she was encouraged to bring  the bottles to every visit to confirm accuracy of list.  Return in 6 weeks or sooner for any questions/problems before then.

## 2018-02-14 ENCOUNTER — Encounter: Payer: Self-pay | Admitting: Family

## 2018-02-14 ENCOUNTER — Telehealth: Payer: Self-pay | Admitting: Family

## 2018-02-14 MED ORDER — POTASSIUM CHLORIDE CRYS ER 20 MEQ PO TBCR: 40.0000 meq | EXTENDED_RELEASE_TABLET | Freq: Every day | ORAL | 3 refills | Status: DC

## 2018-02-14 NOTE — Telephone Encounter (Signed)
Spoke with patient regarding lab results that were obtained 02/13/18. Renal function looks good but potassium level is low at 3.0. She is currently taking potassium 20meq once daily.   Advised patient to take 2 additional potassium tablets today and then beginning tomorrow, take 2 potassium tablets daily. Patient verbalized understanding. She sees cardiology at the end of the month so will ask them to recheck it.

## 2018-03-06 NOTE — Progress Notes (Deleted)
Cardiology Office Note Date:  03/06/2018  Patient ID:  Helen Burgess, DOB 04/27/1976, MRN [id number] PCP:  Danella PentonMiller, Mark F, MD  Cardiologist:  New to [facility] - will establish with Dr. Marland Kitchen***  ***refresh   Chief Complaint: Establish care  History of Present Illness: Helen Burgess is a 42 y.o. female with history of ASD s/p repair at age 42, reported chronic diastolic CHF, SBO, migraine disorder, and anxiety who presents to establish care.   Patient has been followed by the [facility] for diastolic CHF.   Prior echo from 03/2015 done for dyspnea showed an EF of 55-65%, trivial MR, and mild TR. She was seen in the ED 10/2017 for atypical chest pain with troponin negative x 1, WBC 12.7, PLT 483, K+ 4.4, SCr 0.56, CXR with cardiomegaly and chronic changes without active disease. EKG showed sinus tachycardia, 109 bpm, diffuse nonspecific st/t changes. She was diagnosed with a panic attack. She returned to the ED on 01/05/18 with bilateral pedal edema with a reported weight gain of 10 pounds that week with associated orthopnea. Weight noted to be 193 pounds (prior ED visit in 10/2017 with a reported weight of 177 pounds). Labs showed a WBC 10.9, HGB 12.8, PLT 341, K+ 3.6, SCr 0.63, LFT normal, lipase 22, BNP 44, CXR with stable cardiomegaly with mild pulmonary vascular congestion. EKG showed NSR, 84 bpm, diffuse TWI. There was some concern her lower extremity swelling may have been in the setting of recently started Lamictal. She was started on low-dose Lasix and referred to the [facility]. She saw them on 5/24 with a BP of 140/94, heart rate 114 bpm, weight 188 pounds. Echo was scheduled, not changes were made. Echo on 01/18/18 showed an EF of 55-65%, normal LV diastolic function, normal size left atrium, RVSF normal, PASP unable to be estimated, challenging image quality. In follow up with the [facility] on 6/5, BP remained elevated at 141/92, weight 185 pounds. Labs showed stable  renal function and potassium (0.64/3.7 respectively). Lasix was increased to 40 mg daily along with addition of KCl 20 mEq daily. She was most recently seen by the [facility] on 02/13/18 with a BP of 131/95, 107 bpm, weight 183 pounds. She was started on Toprol XL 25 mg daily. Labs checked 7/1 showed a K+ of 3.0, she was advised to increase KCl to 40 mEq daily. SCr stable at 0.66.  ***  Past Medical History:  Diagnosis Date  . ASD (atrial septal defect)   . Bowel obstruction (HCC)   . CHF (congestive heart failure) (HCC)   . Dermatillomania   . Hx SBO   . Migraines   . Occipital neuralgia   . Panic attacks     Past Surgical History:  Procedure Laterality Date  . ABDOMINAL SURGERY    . CHOLECYSTECTOMY    . 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    Allergies:   Nsaids and Ultram [tramadol]   Social History:  The patient  reports that she has never smoked. She has never used smokeless tobacco. She reports that she does not drink alcohol or use drugs.   Family History:  The patient's family history is not on file.  ROS:   ROS   PHYSICAL EXAM: *** VS:  There were no vitals  taken for this visit. BMI: There is no height or weight on file to calculate BMI.  Physical Exam   EKG:  Was ordered and interpreted by me today. Shows ***  Recent Labs: 01/05/2018: ALT 22; B Natriuretic Peptide 44.0; Hemoglobin 12.8; Platelets 341 02/13/2018: BUN 6; Creatinine, Ser 0.66; Potassium 3.0; Sodium 136  No results found for requested labs within last 8760 hours.   CrCl cannot be calculated (Unknown ideal weight.).   Wt Readings from Last 3 Encounters:  02/13/18 183 lb 2 oz (83.1 kg)  01/18/18 185 lb 8 oz (84.1 kg)  01/06/18 188 lb 8 oz (85.5 kg)     Other studies reviewed: Additional studies/records reviewed today  include: summarized above  ASSESSMENT AND PLAN:  1. ***  Disposition: F/u with *** in   Current medicines are reviewed at length with the patient today.  The patient did not have any concerns regarding medicines.  Signed, Eula Listen, PA-C 03/06/2018 12:14 PM     [facility] [address] [place], [place] [address] [phone]

## 2018-03-07 ENCOUNTER — Ambulatory Visit: Payer: Self-pay | Admitting: Physician Assistant

## 2018-03-09 ENCOUNTER — Encounter: Payer: Self-pay | Admitting: Physician Assistant

## 2018-03-17 ENCOUNTER — Telehealth: Payer: Self-pay | Admitting: Family

## 2018-03-17 NOTE — Telephone Encounter (Signed)
Patient called to ask for a sooner appointment. She had asked her company about FMLA forms as she feels like she needs to take a leave of absence from work. She is tearful as she says that she is realizing that her taking lasix is not enough and that this is a long term illness.  She also says that when she first started the metoprolol, she would pass out without knowing what had happened. She says that she began taking the medication at bedtime and hasn't had any further issues since then.   She did not show for her cardiology appointment as she forgot about it but is agreeable to us rescheduling that for her. We will see her in the [facility] 03/21/18.

## 2018-03-21 ENCOUNTER — Telehealth: Payer: Self-pay | Admitting: Family

## 2018-03-21 ENCOUNTER — Encounter: Payer: Self-pay | Admitting: Family

## 2018-03-21 ENCOUNTER — Ambulatory Visit: Payer: 59 | Attending: Family | Admitting: Family

## 2018-03-21 VITALS — BP 141/83 | HR 87 | Resp 18 | Ht 67.0 in | Wt 179.1 lb

## 2018-03-21 DIAGNOSIS — Q211 Atrial septal defect: Secondary | ICD-10-CM | POA: Insufficient documentation

## 2018-03-21 DIAGNOSIS — Z833 Family history of diabetes mellitus: Secondary | ICD-10-CM | POA: Diagnosis not present

## 2018-03-21 DIAGNOSIS — I110 Hypertensive heart disease with heart failure: Secondary | ICD-10-CM | POA: Diagnosis not present

## 2018-03-21 DIAGNOSIS — G43909 Migraine, unspecified, not intractable, without status migrainosus: Secondary | ICD-10-CM | POA: Insufficient documentation

## 2018-03-21 DIAGNOSIS — R5382 Chronic fatigue, unspecified: Secondary | ICD-10-CM | POA: Insufficient documentation

## 2018-03-21 DIAGNOSIS — I10 Essential (primary) hypertension: Secondary | ICD-10-CM

## 2018-03-21 DIAGNOSIS — F410 Panic disorder [episodic paroxysmal anxiety] without agoraphobia: Secondary | ICD-10-CM | POA: Diagnosis not present

## 2018-03-21 DIAGNOSIS — Z09 Encounter for follow-up examination after completed treatment for conditions other than malignant neoplasm: Secondary | ICD-10-CM | POA: Diagnosis not present

## 2018-03-21 DIAGNOSIS — Z886 Allergy status to analgesic agent status: Secondary | ICD-10-CM | POA: Insufficient documentation

## 2018-03-21 DIAGNOSIS — Z79899 Other long term (current) drug therapy: Secondary | ICD-10-CM | POA: Diagnosis not present

## 2018-03-21 DIAGNOSIS — I5032 Chronic diastolic (congestive) heart failure: Secondary | ICD-10-CM | POA: Diagnosis not present

## 2018-03-21 DIAGNOSIS — E876 Hypokalemia: Secondary | ICD-10-CM | POA: Diagnosis not present

## 2018-03-21 DIAGNOSIS — Z888 Allergy status to other drugs, medicaments and biological substances status: Secondary | ICD-10-CM | POA: Diagnosis not present

## 2018-03-21 DIAGNOSIS — Z9049 Acquired absence of other specified parts of digestive tract: Secondary | ICD-10-CM | POA: Diagnosis not present

## 2018-03-21 DIAGNOSIS — K56609 Unspecified intestinal obstruction, unspecified as to partial versus complete obstruction: Secondary | ICD-10-CM | POA: Insufficient documentation

## 2018-03-21 LAB — BASIC METABOLIC PANEL
ANION GAP: 12 (ref 5–15)
BUN: 6 mg/dL (ref 6–20)
CALCIUM: 9.5 mg/dL (ref 8.9–10.3)
CO2: 26 mmol/L (ref 22–32)
Chloride: 105 mmol/L (ref 98–111)
Creatinine, Ser: 0.64 mg/dL (ref 0.44–1.00)
GFR calc Af Amer: >60 mL/min (ref >60)
Glucose, Bld: 143 mg/dL — ABNORMAL HIGH (ref 70–99)
Potassium: 3.3 mmol/L — ABNORMAL LOW (ref 3.5–5.1)
Sodium: 143 mmol/L (ref 135–145)

## 2018-03-21 NOTE — Telephone Encounter (Signed)
Spoke with patient regarding lab results obtained today (03/21/18). Patient says that she hasn't taken her potassium in about 2 weeks but has been drinking gatorade due to decreased appetite. Potassium level today was 3.3. Renal function looks great.   Advised patient to resume taking her potassium as 20meq daily and we will recheck at next visit. Patient verbalized understanding

## 2018-03-21 NOTE — Patient Instructions (Signed)
Continue weighing daily and call for an overnight weight gain of > 2 pounds or a weekly weight gain of >5 pounds. 

## 2018-03-21 NOTE — Progress Notes (Signed)
Patient ID: Helen Burgess, female    DOB: [birth year], 42 y.o.   MRN: [id number]  HPI  Helen Burgess is a 42 y/o female with a history of HTN, panic attacks, migraines, atrial septal defect and chronic heart failure.   Echo report from 01/18/18 reviewed and showed an EF of 55-65%. Echo report from 03/30/15 reviewed and showed an EF of 55-65%.   Was in the ED 01/05/18 due to pedal edema where she was treated with lasix and released. She was in the ED 10/22/17 due to atypical chest pain and panic attack where she was evaluated and released.   She presents today for her follow-up visit with a chief complaint of chronic fatigue upon minimal exertion. She says that this has been present for several months but seems to be worsening. She has associated shortness of breath, decreased appetite, light-headedness, weakness, chronic back pain, anxiety and depression along with this. She denies any difficulty sleeping, abdominal distention, palpitations, pedal edema, chest pain, cough or weight gain. She says that she's lost her appetite completely due to stress and depression. Very tearful in the office. Tolerating metoprolol now that she's taking it at night. Says that she hasn't been taking any potassium for the last couple of weeks because she hasn't been eating much and she didn't want to take it on an empty stomach.   Past Medical History:  Diagnosis Date  . ASD (atrial septal defect)   . Bowel obstruction (HCC)   . CHF (congestive heart failure) (HCC)   . Dermatillomania   . Hx SBO   . Migraines   . Occipital neuralgia   . Panic attacks    Past Surgical History:  Procedure Laterality Date  . ABDOMINAL SURGERY    . CHOLECYSTECTOMY    . 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 Diabetes Neg Hx    Social History   Tobacco Use  . Smoking status: Never Smoker  . Smokeless tobacco: Never Used  Substance Use Topics   . Alcohol use: No   Allergies  Allergen Reactions  . Nsaids Other (See Comments)    Pancreatitis.  Marland Kitchen Ultram [Tramadol] Itching   Prior to Admission medications   Medication Sig Start Date End Date Taking? Authorizing Provider  AJOVY 225 MG/1.5ML SOSY Take 1 mL by mouth every 30 (thirty) days. 12/22/17  Yes [provider]  cyclobenzaprine (FLEXERIL) 10 MG tablet Take 1 tablet (10 mg total) by mouth at bedtime. Patient taking differently: Take 10 mg by mouth 3 (three) times daily.  05/02/15  Yes [name], MD  diazepam (VALIUM) 5 MG tablet Take 5 mg by mouth at bedtime.    Yes [provider]  diazepam (VALIUM) 5 MG tablet Take 2.5 mg by mouth 2 (two) times daily as needed for anxiety.   Yes [provider]  escitalopram (LEXAPRO) 5 MG tablet Take 5 mg by mouth daily.   Yes [provider]  furosemide (LASIX) 40 MG tablet Take 1 tablet (40 mg total) by mouth daily. 01/19/18 04/19/18 Yes Hackney, Inetta Fermo A, FNP  gabapentin (NEURONTIN) 300 MG capsule Take 300 mg by mouth 2 (two) times daily.   Yes [provider]  metoprolol succinate (TOPROL XL) 25 MG 24 hr tablet Take 1 tablet (25 mg total) by mouth daily. 02/13/18  Yes Delma Freeze, FNP  oxyCODONE ER [facility]) 18  MG C12A Take 1 tablet by mouth 2 (two) times daily.   Yes [provider]  oxyCODONE-acetaminophen (PERCOCET) 10-325 MG tablet Take 1 tablet by mouth every 4 (four) hours as needed for pain. Take 1 tablet by mouth every 4 (four)- 6(six) hours as needed for pain. 12/23/17  Yes [provider]  rizatriptan (MAXALT-MLT) 10 MG disintegrating tablet Take 1 tablet (10 mg total) by mouth as needed for migraine. May repeat in 2 hours if needed Patient taking differently: Take 5 mg by mouth as needed for migraine. May repeat in 2 hours if needed 10/27/15  Yes Conty, Pamala Hurryrlando, MD  potassium chloride SA (K-DUR,KLOR-CON) 20 MEQ tablet Take 2 tablets (40 mEq total) by mouth  daily. Patient not taking: Reported on 03/21/2018 02/14/18   Delma FreezeHackney, Tina A, FNP    Review of Systems  Constitutional: Positive for appetite change (decreased) and fatigue.  HENT: Negative for congestion, postnasal drip and sore throat.   Eyes: Negative.   Respiratory: Positive for shortness of breath (with minimal exertion). Negative for cough and chest tightness.   Cardiovascular: Negative for chest pain, palpitations and leg swelling.  Gastrointestinal: Negative for abdominal distention and abdominal pain.  Endocrine: Negative.   Genitourinary: Negative.   Musculoskeletal: Positive for back pain (chronic) and neck pain.  Skin: Negative.   Allergic/Immunologic: Negative.   Neurological: Positive for weakness and light-headedness. Negative for dizziness and headaches.  Hematological: Negative for adenopathy. Does not bruise/bleed easily.  Psychiatric/Behavioral: Positive for dysphoric mood (sees [facility]). Negative for sleep disturbance and suicidal ideas. The patient is nervous/anxious.    Vitals:   03/21/18 0837  BP: (!) 141/83  Pulse: 87  Resp: 18  SpO2: 99%  Weight: 179 lb 2 oz (81.3 kg)  Height: 5\' 7"  (1.702 m)   Wt Readings from Last 3 Encounters:  03/21/18 179 lb 2 oz (81.3 kg)  02/13/18 183 lb 2 oz (83.1 kg)  01/18/18 185 lb 8 oz (84.1 kg)   Lab Results  Component Value Date   CREATININE 0.66 02/13/2018   CREATININE 0.64 01/18/2018   CREATININE 0.63 01/05/2018   Physical Exam  Constitutional: She is oriented to person, place, and time. She appears well-developed and well-nourished.  HENT:  Head: Normocephalic and atraumatic.  Neck: Normal range of motion. Neck supple. No JVD present.  Cardiovascular: Normal rate and regular rhythm.  Pulmonary/Chest: Effort normal. No respiratory distress. She has no wheezes. She has no rales.  Abdominal: Soft. She exhibits no distension.  Musculoskeletal: She exhibits no tenderness.       Right lower leg: She  exhibits no tenderness.       Left lower leg: She exhibits no tenderness.  Neurological: She is alert and oriented to person, place, and time.  Skin: Skin is warm and dry.  Psychiatric: She has a normal mood and affect. Her behavior is normal.  Nursing note and vitals reviewed.  Assessment & Plan:  1: Chronic heart failure with preserved ejection fraction- - NYHA class III - euvolemic today - weighing daily. Reminded to call for an overnight weight gain of >2 pounds or a weekly weight gain of >5 pounds - weight down 4 pounds since she was last here ~ 1 month ago - not adding salt to her food and has been reading food labels. Reviewed the importance of closely following a 2000mg  sodium diet - get a BMP today since she hasn't been taking her potassium for the last couple of weeks. Was low when checked  last time - had appointment with cardiology ([facility]) 03/07/18 but patient did not show so this was rescheduled for 05/23/18 - had ASD repaired at the age of 56  - BNP 01/05/18 was 44.0  2: HTN-  - BP looks good today - saw PCP Hyacinth Meeker) 11/30/16 but is planning on getting established elsewhere. Emphasized the importance of getting established with a PCP - BMP 02/13/18 reviewed and showed sodium 136, potassium 3.0 and GFR >60  3: Panic attacks- - tolerating metoprolol succinate at bedtime now (had syncope episodes with first couple of doses in daytime) - under extreme stress with finances, work, landlord issues and child issues - follows with behavioral health and has an appointment tomorrow; emphasized how important it was to tell them everything so they can treat accordingly - mom died when she was 62 years old, dad died 2 years ago, trusted medical provider died 2 months after her dad, brother hung himself 6 years ago, 41 y/o son is living with her but isn't helping out around the house and her work responsibilities are extremely demanding and she's having trouble concentrating enough to do the job  well. 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 Each medication was verbally reviewed with the patient and she was encouraged to bring the bottles to every visit to confirm accuracy of list.  Return in 2 weeks or sooner for any questions/problems before then.

## 2018-03-29 NOTE — Progress Notes (Signed)
Patient ID: Helen Burgess, female    DOB: 03/16/1976, 42 y.o.   MRN: [id number]  HPI  Helen Burgess is a 42 y/o female with a history of HTN, panic attacks, migraines, atrial septal defect and chronic heart failure.   Echo report from 01/18/18 reviewed and showed an EF of 55-65%. Echo report from 03/30/15 reviewed and showed an EF of 55-65%.   Was in the ED 01/05/18 due to pedal edema where she was treated with lasix and released. She was in the ED 10/22/17 due to atypical chest pain and panic attack where she was evaluated and released.   She presents today for her follow-up visit with a chief complaint of moderate shortness of breath upon minimal exertion. She says this has been present for several months. She has associated fatigue, pedal edema, abdominal distention (worsening), light-headedness, back pain and weight gain along with this. She denies any difficulty sleeping or chest pain. Has had 3 episodes of passing out since she was last here and this all started since the initiation of metoprolol. Most recent event was last night and she hit her head. Denies any fainting episodes prior to metoprolol and she thought taking it at night was working but then the episodes started happening again. Has gained 10 pounds of fluid per her home scale.   Past Medical History:  Diagnosis Date  . ASD (atrial septal defect)   . Bowel obstruction (HCC)   . CHF (congestive heart failure) (HCC)   . Dermatillomania   . Hx SBO   . Migraines   . Occipital neuralgia   . Panic attacks    Past Surgical History:  Procedure Laterality Date  . ABDOMINAL SURGERY    . CHOLECYSTECTOMY    . 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 Diabetes Neg Hx    Social History   Tobacco Use  . Smoking status: Never Smoker  . Smokeless tobacco: Never Used  Substance Use Topics  . Alcohol use: No   Allergies  Allergen Reactions  .  Nsaids Other (See Comments)    Pancreatitis.  Marland Kitchen. Ultram [Tramadol] Itching   Prior to Admission medications   Medication Sig Start Date End Date Taking? Authorizing Provider  AJOVY 225 MG/1.5ML SOSY Take 1 mL by mouth every 30 (thirty) days. 12/22/17  Yes [provider]  calcium carbonate (TUMS - DOSED IN MG ELEMENTAL CALCIUM) 500 MG chewable tablet Chew 1 tablet by mouth as needed for indigestion or heartburn.   Yes [provider]  cetirizine (ZYRTEC) 10 MG tablet Take 10 mg by mouth daily as needed for allergies (seasonal - spring/fall).   Yes [provider]  cyclobenzaprine (FLEXERIL) 10 MG tablet Take 1 tablet (10 mg total) by mouth at bedtime. Patient taking differently: Take 10 mg by mouth 3 (three) times daily.  05/02/15  Yes Payton Mccallumonty, [place], MD  diazepam (VALIUM) 5 MG tablet Take 5 mg by mouth 2 (two) times daily.    Yes [provider]  escitalopram (LEXAPRO) 5 MG tablet Take 10 mg by mouth daily.    Yes [provider]  furosemide (LASIX) 40 MG tablet Take 1 tablet (40 mg total) by mouth daily. 01/19/18 04/19/18 Yes Hackney, Inetta Fermoina A, FNP  gabapentin (NEURONTIN) 300 MG capsule Take 300 mg by mouth 2 (two) times daily.   Yes [provider]  metoprolol succinate (TOPROL  XL) 25 MG 24 hr tablet Take 1 tablet (25 mg total) by mouth daily. 02/13/18  Yes Hackney, Inetta Fermoina A, FNP  naloxone [facility](NARCAN) nasal spray 4 mg/0.1 mL Place 1 spray into the nose as needed.   Yes [provider]  oxyCODONE ER (XTAMPZA ER) 27 MG C12A Take 1 tablet by mouth 2 (two) times daily.    Yes [provider]  oxyCODONE-acetaminophen (PERCOCET) 10-325 MG tablet Take 1 tablet by mouth every 8 (eight) hours as needed for pain.  12/23/17  Yes [provider]  potassium chloride SA (K-DUR,KLOR-CON) 20 MEQ tablet Take 2 tablets (40 mEq total) by mouth daily. Patient taking differently: Take 20 mEq by mouth daily.  02/14/18  Yes Hackney, Tina A, FNP   QUEtiapine (SEROQUEL) 50 MG tablet Take 50 mg by mouth at bedtime.   Yes [provider]  rizatriptan (MAXALT) 5 MG tablet Take 5 mg by mouth as needed for migraine. May repeat in 2 hours if needed   Yes [provider]  diazepam (VALIUM) 5 MG tablet Take 2.5 mg by mouth 2 (two) times daily as needed for anxiety.    [provider]  rizatriptan (MAXALT-MLT) 10 MG disintegrating tablet Take 1 tablet (10 mg total) by mouth as needed for migraine. May repeat in 2 hours if needed Patient not taking: Reported on 03/30/2018 10/27/15   Payton Mccallumonty, [place], MD    Review of Systems  Constitutional: Positive for appetite change (decreased) and fatigue.  HENT: Negative for congestion, postnasal drip and sore throat.   Eyes: Negative.   Respiratory: Positive for shortness of breath (with minimal exertion). Negative for cough and chest tightness.   Cardiovascular: Positive for leg swelling. Negative for chest pain and palpitations.  Gastrointestinal: Positive for abdominal distention. Negative for abdominal pain.  Endocrine: Negative.   Genitourinary: Negative.   Musculoskeletal: Positive for back pain (chronic) and neck pain.  Skin: Negative.        Bruising LLQ  Allergic/Immunologic: Negative.   Neurological: Positive for syncope, weakness and light-headedness. Negative for dizziness and headaches.  Hematological: Negative for adenopathy. Does not bruise/bleed easily.  Psychiatric/Behavioral: Positive for dysphoric mood (sees [facility]). Negative for sleep disturbance and suicidal ideas. The patient is nervous/anxious.    Vitals:   03/30/18 1020  BP: (!) 151/72  Pulse: 95  Resp: 18  SpO2: 97%  Weight: 187 lb 4 oz (84.9 kg)  Height: 5\' 7"  (1.702 m)   Wt Readings from Last 3 Encounters:  03/30/18 187 lb 4 oz (84.9 kg)  03/21/18 179 lb 2 oz (81.3 kg)  02/13/18 183 lb 2 oz (83.1 kg)   Lab Results  Component Value Date   CREATININE 0.64 03/21/2018    CREATININE 0.66 02/13/2018   CREATININE 0.64 01/18/2018   Physical Exam  Constitutional: She is oriented to person, place, and time. She appears well-developed and well-nourished.  HENT:  Head: Normocephalic and atraumatic.  Neck: Normal range of motion. Neck supple. No JVD present.  Cardiovascular: Normal rate and regular rhythm.  Pulmonary/Chest: Effort normal. No respiratory distress. She has no wheezes. She has no rales.  Abdominal: Soft. She exhibits distension. There is tenderness (over LLQ due to bruising).  Musculoskeletal: She exhibits no tenderness.       Right lower leg: She exhibits edema (3+ pitting). She exhibits no tenderness.       Left lower leg: She exhibits edema (3+ pitting). She exhibits no tenderness.  Neurological: She is alert and oriented to person,  place, and time.  Skin: Skin is warm and dry. Abrasion (LLQ), bruising (LLQ) and ecchymosis (LLQ) noted.  Psychiatric: She has a normal mood and affect. Her behavior is normal.  Nursing note and vitals reviewed.  Assessment & Plan:  1: Chronic heart failure with preserved ejection fraction- - NYHA class III - moderately fluid overloaded today - weighing daily and has gained 10 pounds by her home scale. Reinforced the importance of calling for an overnight weight gain of >2 pounds or a weekly weight gain of >5 pounds - weight up 8 pounds by our scale since she was last here 9 days ago - will give 80mg  IV lasix along with potassium in [facility] today - BMP/BNP will be drawn today - not adding salt to her food and has been reading food labels. Reviewed the importance of closely following a 2000mg  sodium diet - had appointment with cardiology ([facility]) 03/07/18 but patient did not show so this was rescheduled for 05/23/18 - had ASD repaired at the age of 39  - BNP 01/05/18 was 44.0 - PharmD reconciled medications with the patient   2: HTN-  - BP mildly elevated today - saw PCP Hyacinth Meeker) 11/30/16 but is  planning on getting established elsewhere. Emphasized the importance of getting established with a PCP - BMP 03/21/18 reviewed and showed sodium 143, potassium 3.3 and GFR >60  3: Panic attacks- - under extreme stress with finances, work, landlord issues and child issues - follows with behavioral health and has an appointment tomorrow; emphasized how important it was to tell them everything so they can treat accordingly - mom died when she was 10 years old, dad died 2 years ago, trusted medical provider died 2 months after her dad, brother hung himself 6 years ago, 75 y/o son is living with her but isn't helping out around the house and her work responsibilities are extremely demanding and she's having trouble concentrating enough to do the job well. (has to process 27 insurance claims completely and 100% correct/ hour) - adamantly denies suicidal thoughts  4: Syncope- - patient says that she's fallen/ passed out 3 times since she was last here; says that she blacks out and wakes up on the floor - most recent time was last night and she hit her right forehead - few days ago, she fell into the corner of her nightstand and has a large bruise on the LLQ of her abdomen along with healing laceration - symptoms have occurred since she's been taking metoprolol for her tachycardia - advised patient to stop taking the metoprolol; should syncope continue could consider placing holter but doubt that will be needed since she says that she wasn't passing out prior to taking the medication - will add metoprolol to her allergies today - she will need to remain out of work at this time  5: Lymphedema- - stage 2 - limited in her ability to exercise due to shortness of breath - encouraged her to get compression socks and wear them daily with removal at bedtime - encouraged her to also elevate her legs when sitting for long periods of time - consider lymphapress compression pumps if edema persists  Patient did  not bring her medications nor a list. Each medication was verbally reviewed with the patient and she was encouraged to bring the bottles to every visit to confirm accuracy of list.  Return in 4 days for a recheck or sooner for any questions/problems before then.

## 2018-03-30 ENCOUNTER — Other Ambulatory Visit: Payer: Self-pay | Admitting: Family

## 2018-03-30 ENCOUNTER — Encounter: Payer: Self-pay | Admitting: Family

## 2018-03-30 ENCOUNTER — Ambulatory Visit
Admission: RE | Admit: 2018-03-30 | Discharge: 2018-03-30 | Disposition: A | Discharge status: Home / Self Care | Payer: 59 | Source: Ambulatory Visit | Attending: Family | Admitting: Family

## 2018-03-30 ENCOUNTER — Ambulatory Visit: Payer: 59 | Admitting: Family

## 2018-03-30 VITALS — BP 151/72 | HR 95 | Resp 18 | Ht 67.0 in | Wt 187.2 lb

## 2018-03-30 DIAGNOSIS — Z79899 Other long term (current) drug therapy: Secondary | ICD-10-CM

## 2018-03-30 DIAGNOSIS — Q211 Atrial septal defect: Secondary | ICD-10-CM

## 2018-03-30 DIAGNOSIS — I5033 Acute on chronic diastolic (congestive) heart failure: Secondary | ICD-10-CM

## 2018-03-30 DIAGNOSIS — I890 Lymphedema, not elsewhere classified: Secondary | ICD-10-CM | POA: Insufficient documentation

## 2018-03-30 DIAGNOSIS — E876 Hypokalemia: Secondary | ICD-10-CM | POA: Insufficient documentation

## 2018-03-30 DIAGNOSIS — R55 Syncope and collapse: Secondary | ICD-10-CM | POA: Insufficient documentation

## 2018-03-30 DIAGNOSIS — Z886 Allergy status to analgesic agent status: Secondary | ICD-10-CM | POA: Insufficient documentation

## 2018-03-30 DIAGNOSIS — F410 Panic disorder [episodic paroxysmal anxiety] without agoraphobia: Secondary | ICD-10-CM

## 2018-03-30 DIAGNOSIS — Z79891 Long term (current) use of opiate analgesic: Secondary | ICD-10-CM

## 2018-03-30 DIAGNOSIS — Z9049 Acquired absence of other specified parts of digestive tract: Secondary | ICD-10-CM

## 2018-03-30 DIAGNOSIS — I5032 Chronic diastolic (congestive) heart failure: Secondary | ICD-10-CM

## 2018-03-30 DIAGNOSIS — Z09 Encounter for follow-up examination after completed treatment for conditions other than malignant neoplasm: Secondary | ICD-10-CM

## 2018-03-30 DIAGNOSIS — G43909 Migraine, unspecified, not intractable, without status migrainosus: Secondary | ICD-10-CM | POA: Insufficient documentation

## 2018-03-30 DIAGNOSIS — I110 Hypertensive heart disease with heart failure: Secondary | ICD-10-CM | POA: Insufficient documentation

## 2018-03-30 DIAGNOSIS — I10 Essential (primary) hypertension: Secondary | ICD-10-CM

## 2018-03-30 DIAGNOSIS — Z888 Allergy status to other drugs, medicaments and biological substances status: Secondary | ICD-10-CM

## 2018-03-30 LAB — BASIC METABOLIC PANEL
Anion gap: 9 (ref 5–15)
BUN: 6 mg/dL (ref 6–20)
CHLORIDE: 99 mmol/L (ref 98–111)
CO2: 28 mmol/L (ref 22–32)
CREATININE: 0.56 mg/dL (ref 0.44–1.00)
Calcium: 8.8 mg/dL — ABNORMAL LOW (ref 8.9–10.3)
GFR calc non Af Amer: >60 mL/min (ref >60)
GLUCOSE: 149 mg/dL — AB (ref 70–99)
Potassium: 3.4 mmol/L — ABNORMAL LOW (ref 3.5–5.1)
Sodium: 136 mmol/L (ref 135–145)

## 2018-03-30 LAB — BRAIN NATRIURETIC PEPTIDE: B Natriuretic Peptide: 16 pg/mL (ref 0.0–100.0)

## 2018-03-30 MED ORDER — SODIUM CHLORIDE FLUSH 0.9 % IV SOLN
INTRAVENOUS | Status: AC
  Filled 2018-03-30: qty 10

## 2018-03-30 MED ORDER — POTASSIUM CHLORIDE CRYS ER 20 MEQ PO TBCR
EXTENDED_RELEASE_TABLET | ORAL | Status: AC
  Administered 2018-03-30: 40 meq via ORAL
  Filled 2018-03-30: qty 2

## 2018-03-30 MED ORDER — POTASSIUM CHLORIDE CRYS ER 20 MEQ PO TBCR
40.0000 meq | EXTENDED_RELEASE_TABLET | Freq: Once | ORAL | Status: AC
  Administered 2018-03-30: 40 meq via ORAL

## 2018-03-30 MED ORDER — FUROSEMIDE 10 MG/ML IJ SOLN
INTRAMUSCULAR | Status: AC
  Administered 2018-03-30: 80 mg via INTRAVENOUS
  Filled 2018-03-30: qty 8

## 2018-03-30 MED ORDER — FUROSEMIDE 10 MG/ML IJ SOLN
80.0000 mg | Freq: Once | INTRAMUSCULAR | Status: AC
  Administered 2018-03-30: 80 mg via INTRAVENOUS

## 2018-03-30 NOTE — Patient Instructions (Addendum)
Continue weighing daily and call for an overnight weight gain of > 2 pounds or a weekly weight gain of >5 pounds.  Stop metoprolol 

## 2018-03-30 NOTE — Discharge Instructions (Signed)
Follow up with Helen Burgess in heart failure clinic as directed and follow her instructions from today.

## 2018-04-03 ENCOUNTER — Ambulatory Visit: Payer: 59 | Attending: Family | Admitting: Family

## 2018-04-03 ENCOUNTER — Encounter: Payer: Self-pay | Admitting: Family

## 2018-04-03 ENCOUNTER — Ambulatory Visit: Payer: Self-pay | Admitting: Family

## 2018-04-03 VITALS — BP 130/86 | HR 104 | Resp 18 | Ht 67.0 in | Wt 189.5 lb

## 2018-04-03 DIAGNOSIS — I890 Lymphedema, not elsewhere classified: Secondary | ICD-10-CM | POA: Diagnosis not present

## 2018-04-03 DIAGNOSIS — Z79899 Other long term (current) drug therapy: Secondary | ICD-10-CM | POA: Diagnosis not present

## 2018-04-03 DIAGNOSIS — R55 Syncope and collapse: Secondary | ICD-10-CM | POA: Diagnosis not present

## 2018-04-03 DIAGNOSIS — Z888 Allergy status to other drugs, medicaments and biological substances status: Secondary | ICD-10-CM | POA: Insufficient documentation

## 2018-04-03 DIAGNOSIS — G43909 Migraine, unspecified, not intractable, without status migrainosus: Secondary | ICD-10-CM | POA: Diagnosis not present

## 2018-04-03 DIAGNOSIS — I5033 Acute on chronic diastolic (congestive) heart failure: Secondary | ICD-10-CM

## 2018-04-03 DIAGNOSIS — Q211 Atrial septal defect: Secondary | ICD-10-CM | POA: Diagnosis not present

## 2018-04-03 DIAGNOSIS — I5032 Chronic diastolic (congestive) heart failure: Secondary | ICD-10-CM | POA: Insufficient documentation

## 2018-04-03 DIAGNOSIS — I110 Hypertensive heart disease with heart failure: Secondary | ICD-10-CM | POA: Insufficient documentation

## 2018-04-03 DIAGNOSIS — Z9049 Acquired absence of other specified parts of digestive tract: Secondary | ICD-10-CM | POA: Insufficient documentation

## 2018-04-03 DIAGNOSIS — I10 Essential (primary) hypertension: Secondary | ICD-10-CM

## 2018-04-03 DIAGNOSIS — Z886 Allergy status to analgesic agent status: Secondary | ICD-10-CM | POA: Diagnosis not present

## 2018-04-03 DIAGNOSIS — R0602 Shortness of breath: Secondary | ICD-10-CM | POA: Diagnosis present

## 2018-04-03 DIAGNOSIS — F410 Panic disorder [episodic paroxysmal anxiety] without agoraphobia: Secondary | ICD-10-CM | POA: Diagnosis not present

## 2018-04-03 LAB — CBC
HEMATOCRIT: 35.3 % (ref 35.0–47.0)
Hemoglobin: 12.1 g/dL (ref 12.0–16.0)
MCH: 28.5 pg (ref 26.0–34.0)
MCHC: 34.3 g/dL (ref 32.0–36.0)
MCV: 83.1 fL (ref 80.0–100.0)
Platelets: 325 10*3/uL (ref 150–440)
RBC: 4.24 MIL/uL (ref 3.80–5.20)
RDW: 14.3 % (ref 11.5–14.5)
WBC: 9.6 10*3/uL (ref 3.6–11.0)

## 2018-04-03 LAB — BASIC METABOLIC PANEL
Anion gap: 10 (ref 5–15)
BUN: 7 mg/dL (ref 6–20)
CALCIUM: 8.9 mg/dL (ref 8.9–10.3)
CO2: 28 mmol/L (ref 22–32)
Chloride: 99 mmol/L (ref 98–111)
Creatinine, Ser: 0.69 mg/dL (ref 0.44–1.00)
GFR calc Af Amer: >60 mL/min (ref >60)
GLUCOSE: 129 mg/dL — AB (ref 70–99)
POTASSIUM: 3.6 mmol/L (ref 3.5–5.1)
Sodium: 137 mmol/L (ref 135–145)

## 2018-04-03 MED ORDER — TORSEMIDE 20 MG PO TABS: 40.0000 mg | ORAL_TABLET | Freq: Every day | ORAL | 3 refills | Status: DC

## 2018-04-03 NOTE — Patient Instructions (Addendum)
Continue weighing daily and call for an overnight weight gain of > 2 pounds or a weekly weight gain of >5 pounds.  Stop taking the furosemide and will begin torsemide 40mg  daily (2 tablets daily)

## 2018-04-03 NOTE — Progress Notes (Signed)
Patient ID: Helen Burgess, female    DOB: 03/01/1976, 42 y.o.   MRN: [id number]  HPI  Helen Burgess is a 42 y/o female with a history of HTN, panic attacks, migraines, atrial septal defect and chronic heart failure.   Echo report from 01/18/18 reviewed and showed an EF of 55-65%. Echo report from 03/30/15 reviewed and showed an EF of 55-65%.   Was in the ED 01/05/18 due to pedal edema where she was treated with lasix and released. She was in the ED 10/22/17 due to atypical chest pain and panic attack where she was evaluated and released.   She presents today for her follow-up visit with a chief complaint of moderate shortness of breath upon minimal exertion. She says this has been present for several months. She says that she doesn't feel any better since she received IV lasix last week. She has associated fatigue, pedal edema, abdominal distention, light-headedness, weakness, depression and slight weight gain since she was last here. She denies any syncopal episodes since stopping the metoprolol. Has been unable to get compression socks due to finances.   Past Medical History:  Diagnosis Date  . ASD (atrial septal defect)   . Bowel obstruction (HCC)   . CHF (congestive heart failure) (HCC)   . Dermatillomania   . Hx SBO   . Migraines   . Occipital neuralgia   . Panic attacks    Past Surgical History:  Procedure Laterality Date  . ABDOMINAL SURGERY    . CHOLECYSTECTOMY    . 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 Diabetes Neg Hx    Social History   Tobacco Use  . Smoking status: Never Smoker  . Smokeless tobacco: Never Used  Substance Use Topics  . Alcohol use: No   Allergies  Allergen Reactions  . Metoprolol Other (See Comments)    Syncope/ passing out  . Nsaids Other (See Comments)    Pancreatitis.  Marland Kitchen. Ultram [Tramadol] Itching   Prior to Admission medications   Medication Sig Start  Date End Date Taking? Authorizing Provider  AJOVY 225 MG/1.5ML SOSY Take 1 mL by mouth every 30 (thirty) days. 12/22/17  Yes [provider]  calcium carbonate (TUMS - DOSED IN MG ELEMENTAL CALCIUM) 500 MG chewable tablet Chew 1 tablet by mouth as needed for indigestion or heartburn.   Yes [provider]  cetirizine (ZYRTEC) 10 MG tablet Take 10 mg by mouth daily as needed for allergies (seasonal - spring/fall).   Yes [provider]  cyclobenzaprine (FLEXERIL) 10 MG tablet Take 1 tablet (10 mg total) by mouth at bedtime. Patient taking differently: Take 10 mg by mouth 3 (three) times daily.  05/02/15  Yes Payton Mccallumonty, [place], MD  diazepam (VALIUM) 5 MG tablet Take 5 mg by mouth 2 (two) times daily.    Yes [provider]  escitalopram (LEXAPRO) 5 MG tablet Take 10 mg by mouth daily.    Yes [provider]  furosemide (LASIX) 40 MG tablet Take 1 tablet (40 mg total) by mouth daily. 01/19/18 04/19/18 Yes Nick Stults, Inetta Fermoina A, FNP  gabapentin (NEURONTIN) 300 MG capsule Take 300 mg by mouth 2 (two) times daily.   Yes [provider]  naloxone (NARCAN) nasal spray 4 mg/0.1 mL Place 1 spray into the nose as needed.   Yes [provider]  oxyCODONE ER (XTAMPZA ER) 27 MG  C12A Take 1 tablet by mouth 2 (two) times daily.    Yes [provider]  oxyCODONE-acetaminophen (PERCOCET) 10-325 MG tablet Take 1 tablet by mouth every 8 (eight) hours as needed for pain.  12/23/17  Yes [provider]  potassium chloride SA (K-DUR,KLOR-CON) 20 MEQ tablet Take 2 tablets (40 mEq total) by mouth daily. Patient taking differently: Take 20 mEq by mouth daily.  02/14/18  Yes Tempest Frankland A, FNP  QUEtiapine (SEROQUEL) 50 MG tablet Take 50 mg by mouth at bedtime.   Yes [provider]  rizatriptan (MAXALT) 5 MG tablet Take 5 mg by mouth as needed for migraine. May repeat in 2 hours if needed   Yes [provider]   Review of Systems   Constitutional: Positive for appetite change (decreased) and fatigue.  HENT: Negative for congestion, postnasal drip and sore throat.   Eyes: Negative.   Respiratory: Positive for shortness of breath (with minimal exertion). Negative for cough and chest tightness.   Cardiovascular: Positive for leg swelling. Negative for chest pain and palpitations.  Gastrointestinal: Positive for abdominal distention. Negative for abdominal pain.  Endocrine: Negative.   Genitourinary: Negative.   Musculoskeletal: Positive for back pain (chronic) and neck pain.  Skin: Negative.        Bruising LLQ  Allergic/Immunologic: Negative.   Neurological: Positive for weakness and light-headedness. Negative for dizziness, syncope and headaches.  Hematological: Negative for adenopathy. Does not bruise/bleed easily.  Psychiatric/Behavioral: Positive for dysphoric mood (sees [facility]). Negative for sleep disturbance and suicidal ideas. The patient is nervous/anxious.    Vitals:   04/03/18 1334  BP: 130/86  Pulse: (!) 104  Resp: 18  SpO2: 96%  Weight: 189 lb 8 oz (86 kg)  Height: 5\' 7"  (1.702 m)   Wt Readings from Last 3 Encounters:  04/03/18 189 lb 8 oz (86 kg)  03/30/18 187 lb 4 oz (84.9 kg)  03/21/18 179 lb 2 oz (81.3 kg)   Lab Results  Component Value Date   CREATININE 0.56 03/30/2018   CREATININE 0.64 03/21/2018   CREATININE 0.66 02/13/2018   Physical Exam  Constitutional: She is oriented to person, place, and time. She appears well-developed and well-nourished.  HENT:  Head: Normocephalic and atraumatic.  Neck: Normal range of motion. Neck supple. No JVD present.  Cardiovascular: Normal rate and regular rhythm.  Pulmonary/Chest: Effort normal. No respiratory distress. She has no wheezes. She has no rales.  Abdominal: Soft. She exhibits no distension. There is tenderness (over LLQ due to bruising).  Musculoskeletal: She exhibits no tenderness.       Right lower leg: She exhibits  edema (3+ pitting). She exhibits no tenderness.       Left lower leg: She exhibits edema (3+ pitting). She exhibits no tenderness.  Neurological: She is alert and oriented to person, place, and time.  Skin: Skin is warm and dry. Abrasion (LLQ), bruising (LLQ) and ecchymosis (LLQ) noted.  Psychiatric: She has a normal mood and affect. Her behavior is normal.  Nursing note and vitals reviewed.  Assessment & Plan:  1: Acute on Chronic heart failure with preserved ejection fraction- - NYHA class III - moderately fluid overloaded today - weighing daily and has gained another few pounds. Reinforced the importance of calling for an overnight weight gain of >2 pounds or a weekly weight gain of >5 pounds - should the weight gain occur over a weekend, she can take an additional 20mg  torsemide along with extra 20meq potassium - weight  up 2 pounds from last visit here 4 days ago - received 80mg  IV lasix along with potassium since she was last here; patient says her symptoms didn't get any better - will stop her furosemide and begin torsemide 40mg  daily - will draw BMP/ CBC today (history of anemia) - not adding salt to her food and has been reading food labels. Reviewed the importance of closely following a 2000mg  sodium diet - had appointment with cardiology ([facility]) 03/07/18 but patient did not show so this was rescheduled for 05/23/18 - had ASD repaired at the age of 82  - BNP 03/30/18 was 16.0 - discussed paramedicine program with patient but she is not interested at this time as she says that "it would be too much to handle at home right now"  2: HTN-  - BP looks good today - saw PCP Hyacinth Meeker) 11/30/16 but is planning on getting established elsewhere. Emphasized the importance of getting established with a PCP - BMP 03/30/18 reviewed and showed sodium 136, potassium 3.4 and GFR >60  3: Panic attacks- - under extreme stress with finances, work, landlord issues and child issues - follows with  behavioral health and has an appointment next week; says that she may look into getting a different psychiatrist - mom died when she was 49 years old, dad died 2 years ago, trusted medical provider died 2 months after her dad, brother hung himself 6 years ago, 37 y/o son is living with her but isn't helping out around the house and her work responsibilities are extremely demanding and she's having trouble concentrating enough to do the job well. (has to process 27 insurance claims completely and 100% correct/ hour) - adamantly denies suicidal thoughts  4: Syncope- - patient says that she hasn't had any syncopal events since she was here last and metoprolol was stopped - she had 1 episode where she was getting very dizzy and thought she might pass out but grabbed hold of something and the feeling went away - discussed doing a holter but will defer right now as this seems to be getting better and patient agrees with waiting - she will need to remain out of work at this time  5: Lymphedema- - stage 2 - limited in her ability to exercise due to shortness of breath - unable to get compression socks due to finances - encouraged her to also elevate her legs when sitting for long periods of time - will make referral for lymphapress compression pumps   Patient did not bring her medications nor a list. Each medication was verbally reviewed with the patient and she was encouraged to bring the bottles to every visit to confirm accuracy of list.  Return in 1 week or sooner for any questions/problems before then.

## 2018-04-04 ENCOUNTER — Ambulatory Visit: Payer: Self-pay | Admitting: Family

## 2018-04-10 ENCOUNTER — Ambulatory Visit: Payer: 59 | Attending: Family | Admitting: Family

## 2018-04-10 ENCOUNTER — Encounter: Payer: Self-pay | Admitting: Family

## 2018-04-10 VITALS — BP 123/85 | HR 90 | Resp 18 | Ht 67.0 in | Wt 181.1 lb

## 2018-04-10 DIAGNOSIS — Z8719 Personal history of other diseases of the digestive system: Secondary | ICD-10-CM | POA: Insufficient documentation

## 2018-04-10 DIAGNOSIS — I5032 Chronic diastolic (congestive) heart failure: Secondary | ICD-10-CM | POA: Diagnosis not present

## 2018-04-10 DIAGNOSIS — R55 Syncope and collapse: Secondary | ICD-10-CM | POA: Diagnosis not present

## 2018-04-10 DIAGNOSIS — Z886 Allergy status to analgesic agent status: Secondary | ICD-10-CM | POA: Diagnosis not present

## 2018-04-10 DIAGNOSIS — Z8774 Personal history of (corrected) congenital malformations of heart and circulatory system: Secondary | ICD-10-CM | POA: Diagnosis not present

## 2018-04-10 DIAGNOSIS — I10 Essential (primary) hypertension: Secondary | ICD-10-CM | POA: Diagnosis present

## 2018-04-10 DIAGNOSIS — F410 Panic disorder [episodic paroxysmal anxiety] without agoraphobia: Secondary | ICD-10-CM

## 2018-04-10 DIAGNOSIS — Z79899 Other long term (current) drug therapy: Secondary | ICD-10-CM | POA: Insufficient documentation

## 2018-04-10 DIAGNOSIS — Z9049 Acquired absence of other specified parts of digestive tract: Secondary | ICD-10-CM | POA: Diagnosis not present

## 2018-04-10 DIAGNOSIS — Z888 Allergy status to other drugs, medicaments and biological substances status: Secondary | ICD-10-CM | POA: Diagnosis not present

## 2018-04-10 DIAGNOSIS — I890 Lymphedema, not elsewhere classified: Secondary | ICD-10-CM

## 2018-04-10 DIAGNOSIS — I110 Hypertensive heart disease with heart failure: Secondary | ICD-10-CM | POA: Insufficient documentation

## 2018-04-10 LAB — BASIC METABOLIC PANEL
ANION GAP: 8 (ref 5–15)
BUN: 10 mg/dL (ref 6–20)
CHLORIDE: 100 mmol/L (ref 98–111)
CO2: 31 mmol/L (ref 22–32)
CREATININE: 0.71 mg/dL (ref 0.44–1.00)
Calcium: 9.9 mg/dL (ref 8.9–10.3)
GFR calc non Af Amer: >60 mL/min (ref >60)
Glucose, Bld: 112 mg/dL — ABNORMAL HIGH (ref 70–99)
POTASSIUM: 3.9 mmol/L (ref 3.5–5.1)
SODIUM: 139 mmol/L (ref 135–145)

## 2018-04-10 NOTE — Progress Notes (Signed)
Patient ID: Helen Burgess, female    DOB: Mar 25, 1976, 42 y.o.   MRN: [id number]  HPI  [name] is a 42 y/o female with a history of HTN, panic attacks, migraines, atrial septal defect and chronic heart failure.   Echo report from 01/18/18 reviewed and showed an EF of 55-65%. Echo report from 03/30/15 reviewed and showed an EF of 55-65%.   Was in the ED 01/05/18 due to pedal edema where she was treated with lasix and released. She was in the ED 10/22/17 due to atypical chest pain and panic attack where she was evaluated and released.   She presents today for her follow-up visit with a chief complaint of minimal shortness of breath upon moderate exertion. She says that this has been present for several months although has been improving since she was last here. She has associated fatigue (improving), weakness, depression and anxiety along with this. She denies any difficulty sleeping, abdominal distention, palpitations, pedal edema, chest pain, cough, dizziness or weight gain. Started the torsemide at her last visit and reports improvement of symptoms since then. Weight has declined at home.   Past Medical History:  Diagnosis Date  . ASD (atrial septal defect)   . Bowel obstruction (HCC)   . CHF (congestive heart failure) (HCC)   . Dermatillomania   . Hx SBO   . Migraines   . Occipital neuralgia   . Panic attacks    Past Surgical History:  Procedure Laterality Date  . ABDOMINAL SURGERY    . CHOLECYSTECTOMY    . 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 Diabetes Neg Hx    Social History   Tobacco Use  . Smoking status: Never Smoker  . Smokeless tobacco: Never Used  Substance Use Topics  . Alcohol use: No   Allergies  Allergen Reactions  . Metoprolol Other (See Comments)    Syncope/ passing out  . Nsaids Other (See Comments)    Pancreatitis.  Marland Kitchen Ultram [Tramadol] Itching   Prior to  Admission medications   Medication Sig Start Date End Date Taking? Authorizing Provider  AJOVY 225 MG/1.5ML SOSY Take 1 mL by mouth every 30 (thirty) days. 12/22/17  Yes [provider]  calcium carbonate (TUMS - DOSED IN MG ELEMENTAL CALCIUM) 500 MG chewable tablet Chew 1 tablet by mouth as needed for indigestion or heartburn.   Yes [provider]  cetirizine (ZYRTEC) 10 MG tablet Take 10 mg by mouth daily as needed for allergies (seasonal - spring/fall).   Yes [provider]  cyclobenzaprine (FLEXERIL) 10 MG tablet Take 1 tablet (10 mg total) by mouth at bedtime. Patient taking differently: Take 10 mg by mouth 3 (three) times daily.  05/02/15  Yes [name], MD  diazepam (VALIUM) 5 MG tablet Take 5 mg by mouth 2 (two) times daily.    Yes [provider]  escitalopram (LEXAPRO) 5 MG tablet Take 10 mg by mouth daily.    Yes [provider]  gabapentin (NEURONTIN) 300 MG capsule Take 300 mg by mouth 2 (two) times daily.   Yes [provider]  naloxone (NARCAN) nasal spray 4 mg/0.1 mL Place 1 spray into the nose as needed.   Yes [provider]  oxyCODONE ER (XTAMPZA ER) 27 MG C12A Take 1 tablet by mouth 2 (two) times daily.    Yes [provider]  oxyCODONE-acetaminophen (PERCOCET) 10-325 MG tablet Take 1 tablet by mouth every 8 (eight) hours as needed for pain.  12/23/17  Yes [provider]  potassium chloride SA (K-DUR,KLOR-CON) 20 MEQ tablet Take 2 tablets (40 mEq total) by mouth daily. Patient taking differently: Take 20 mEq by mouth daily.  02/14/18  Yes Aisia Correira A, FNP  QUEtiapine (SEROQUEL) 50 MG tablet Take 50 mg by mouth at bedtime.   Yes [provider]  rizatriptan (MAXALT) 5 MG tablet Take 5 mg by mouth as needed for migraine. May repeat in 2 hours if needed   Yes [provider]  torsemide (DEMADEX) 20 MG tablet Take 2 tablets (40 mg total) by mouth daily. 04/03/18 07/02/18 Yes  Delma FreezeHackney, Adrien Shankar A, FNP    Review of Systems  Constitutional: Positive for appetite change (decreased) and fatigue ("better").  HENT: Negative for congestion, postnasal drip and sore throat.   Eyes: Negative.   Respiratory: Positive for shortness of breath ("better"). Negative for cough and chest tightness.   Cardiovascular: Negative for chest pain, palpitations and leg swelling.  Gastrointestinal: Negative for abdominal distention and abdominal pain.  Endocrine: Negative.   Genitourinary: Negative.   Musculoskeletal: Positive for back pain (chronic) and neck pain.  Skin: Negative.   Allergic/Immunologic: Negative.   Neurological: Positive for weakness. Negative for dizziness, syncope, light-headedness and headaches.  Hematological: Negative for adenopathy. Does not bruise/bleed easily.  Psychiatric/Behavioral: Positive for dysphoric mood (sees [facility]). Negative for sleep disturbance and suicidal ideas. The patient is nervous/anxious.    Vitals:   04/10/18 1510  BP: 123/85  Pulse: 90  Resp: 18  SpO2: 96%  Weight: 181 lb 2 oz (82.2 kg)  Height: 5\' 7"  (1.702 m)   Wt Readings from Last 3 Encounters:  04/10/18 181 lb 2 oz (82.2 kg)  04/03/18 189 lb 8 oz (86 kg)  03/30/18 187 lb 4 oz (84.9 kg)   Lab Results  Component Value Date   CREATININE 0.69 04/03/2018   CREATININE 0.56 03/30/2018   CREATININE 0.64 03/21/2018    Physical Exam  Constitutional: She is oriented to person, place, and time. She appears well-developed and well-nourished.  HENT:  Head: Normocephalic and atraumatic.  Neck: Normal range of motion. Neck supple. No JVD present.  Cardiovascular: Normal rate and regular rhythm.  Pulmonary/Chest: Effort normal. No respiratory distress. She has no wheezes. She has no rales.  Abdominal: Soft. She exhibits no distension. There is tenderness (over LLQ due to bruising).  Musculoskeletal: She exhibits no tenderness.       Right lower leg: She exhibits no  tenderness and no edema.       Left lower leg: She exhibits no tenderness and no edema.  Neurological: She is alert and oriented to person, place, and time.  Skin: Skin is warm and dry. Abrasion (LLQ) noted.  Psychiatric: She has a normal mood and affect. Her behavior is normal.  Nursing note and vitals reviewed.  Assessment & Plan:  1: Chronic heart failure with preserved ejection fraction- - NYHA class II - euvolemic today - weighing daily and has gradually lost weight. Reinforced the importance of calling for an overnight weight gain of >2 pounds or a weekly weight gain of >5 pounds - weight down 8 pounds since she was last here a week ago  - diuretic changed to torsemide at her last visit - will get a BMP today since diuretic was changed - not adding salt to her food and has been reading food  labels. Reviewed the importance of closely following a 2000mg  sodium diet - had appointment with cardiology ([facility]) 03/07/18 but patient did not show so this was rescheduled for 05/23/18 - had ASD repaired at the age of 42  - BNP 03/30/18 was 16.0 - discussed paramedicine program with patient but she is not interested at this time as she says that "it would be too much to handle at home right now"  2: HTN-  - BP looks good today - saw PCP Hyacinth Meeker) 11/30/16 but is planning on getting established elsewhere. Emphasized the importance of getting established with a PCP - BMP 04/03/18 reviewed and showed sodium 137, potassium 3.6, creatinine 0.69 and GFR >60  3: Panic attacks- - under extreme stress with finances, work, landlord issues and child issues - has appointment with psychiatrist and therapist later this week - mom died when she was 35 years old, dad died 2 years ago 01/06/2016), trusted medical provider died 2 months after her dad, brother hung himself 6 01/06/12) years ago, 97 y/o son is living with her but isn't helping out around the house and her work responsibilities are extremely demanding and she's  having trouble concentrating enough to do the job well. (has to process 27 insurance claims completely and 100% correct/ hour) - adamantly denies suicidal thoughts  4: Syncope- - no further events since she was last here - may try a different beta-blocker at her next visit if patient continues to be tachycardic  5: Lymphedema- - resolved at this time  Patient did not bring her medications nor a list. Each medication was verbally reviewed with the patient and she was encouraged to bring the bottles to every visit to confirm accuracy of list.  Return in 2 weeks or sooner for any questions/problems before then.

## 2018-04-10 NOTE — Patient Instructions (Signed)
Continue weighing daily and call for an overnight weight gain of > 2 pounds or a weekly weight gain of >5 pounds. 

## 2018-04-24 ENCOUNTER — Telehealth: Payer: Self-pay | Admitting: Family

## 2018-04-24 MED ORDER — METOLAZONE 2.5 MG PO TABS: 2.5000 mg | ORAL_TABLET | Freq: Every day | ORAL | 0 refills | Status: DC

## 2018-04-24 NOTE — Telephone Encounter (Signed)
Patient called saying that she gained 6 pounds overnight. Yesterday she weighed 173.4 pounds and this morning she weighed 179.8. She says that she ate spaghetti last night for dinner.   She says that her breathing is fine and that her feet feel "a little swollen" but not too bad. Currently taking torsemide 40mg  daily along with potassium daily.   Will add metolazone 2.5mg  daily for 2 days. Explained that it's best to take it 1/2 hour prior to torsemide if able. Also advised her that for these next 2 days, to take an additional potassium for each day as well.   Will see patient in clinic on 04/26/18 and check lab work at that time.

## 2018-04-26 ENCOUNTER — Telehealth: Payer: Self-pay | Admitting: Family

## 2018-04-26 ENCOUNTER — Encounter: Payer: Self-pay | Admitting: Family

## 2018-04-26 ENCOUNTER — Ambulatory Visit: Payer: 59 | Attending: Family | Admitting: Family

## 2018-04-26 VITALS — BP 127/80 | HR 81 | Resp 18 | Ht 67.0 in | Wt 180.2 lb

## 2018-04-26 DIAGNOSIS — Z79899 Other long term (current) drug therapy: Secondary | ICD-10-CM | POA: Insufficient documentation

## 2018-04-26 DIAGNOSIS — Q211 Atrial septal defect: Secondary | ICD-10-CM | POA: Insufficient documentation

## 2018-04-26 DIAGNOSIS — Z886 Allergy status to analgesic agent status: Secondary | ICD-10-CM | POA: Diagnosis not present

## 2018-04-26 DIAGNOSIS — I110 Hypertensive heart disease with heart failure: Secondary | ICD-10-CM | POA: Insufficient documentation

## 2018-04-26 DIAGNOSIS — I509 Heart failure, unspecified: Secondary | ICD-10-CM | POA: Diagnosis not present

## 2018-04-26 DIAGNOSIS — Z9049 Acquired absence of other specified parts of digestive tract: Secondary | ICD-10-CM | POA: Diagnosis not present

## 2018-04-26 DIAGNOSIS — F410 Panic disorder [episodic paroxysmal anxiety] without agoraphobia: Secondary | ICD-10-CM | POA: Insufficient documentation

## 2018-04-26 DIAGNOSIS — I5032 Chronic diastolic (congestive) heart failure: Secondary | ICD-10-CM

## 2018-04-26 DIAGNOSIS — I10 Essential (primary) hypertension: Secondary | ICD-10-CM

## 2018-04-26 DIAGNOSIS — Z888 Allergy status to other drugs, medicaments and biological substances status: Secondary | ICD-10-CM | POA: Insufficient documentation

## 2018-04-26 LAB — BASIC METABOLIC PANEL
ANION GAP: 11 (ref 5–15)
BUN: 10 mg/dL (ref 6–20)
CO2: 32 mmol/L (ref 22–32)
Calcium: 9.3 mg/dL (ref 8.9–10.3)
Chloride: 96 mmol/L — ABNORMAL LOW (ref 98–111)
Creatinine, Ser: 0.66 mg/dL (ref 0.44–1.00)
GFR calc non Af Amer: >60 mL/min (ref >60)
GLUCOSE: 159 mg/dL — AB (ref 70–99)
POTASSIUM: 2.8 mmol/L — AB (ref 3.5–5.1)
Sodium: 139 mmol/L (ref 135–145)

## 2018-04-26 MED ORDER — SPIRONOLACTONE 25 MG PO TABS: 25.0000 mg | ORAL_TABLET | Freq: Every day | ORAL | 5 refills | Status: DC

## 2018-04-26 NOTE — Telephone Encounter (Signed)
Spoke with patient regarding lab results obtained today (04/26/18). Renal function looks good but potassium has declined to 2.8. She mentioned during clinic today that she thought her potassium tablets may be making her nauseous.   Discussed using spironolactone 25mg  daily to help retain potassium. Patient will start taking it once daily and for the next 4 days, she will continue taking her potassium tablets. She can then stop the potassium tablets and will recheck her lab work at her next appointment.

## 2018-04-26 NOTE — Progress Notes (Signed)
Patient ID: Helen Burgess, female    DOB: Sep 22, 1975, 42 y.o.   MRN: [id number]  HPI  Helen Burgess is a 42 y/o female with a history of HTN, panic attacks, migraines, atrial septal defect and chronic heart failure.   Echo report from 01/18/18 reviewed and showed an EF of 55-65%. Echo report from 03/30/15 reviewed and showed an EF of 55-65%.   Was in the ED 01/05/18 due to pedal edema where she was treated with lasix and released. She was in the ED 10/22/17 due to atypical chest pain and panic attack where she was evaluated and released.   She presents today for her follow-up visit with a chief complaint of minimal shortness of breath upon moderate exertion. This has been chronic in nature having been present for several months. She has associated fatigue, nausea, vomiting, anxiety and back pain along with this. She denies any difficulty sleeping, abdominal distention, palpitations, chest pain, dizziness or weight gain. Was unable to get metolazone due to finances. She feels like her potassium tablets are the cause of her nausea/vomiting.   Past Medical History:  Diagnosis Date  . ASD (atrial septal defect)   . Bowel obstruction (HCC)   . CHF (congestive heart failure) (HCC)   . Dermatillomania   . Hx SBO   . Migraines   . Occipital neuralgia   . Panic attacks    Past Surgical History:  Procedure Laterality Date  . ABDOMINAL SURGERY    . CHOLECYSTECTOMY    . 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 Diabetes Neg Hx    Social History   Tobacco Use  . Smoking status: Never Smoker  . Smokeless tobacco: Never Used  Substance Use Topics  . Alcohol use: No   Allergies  Allergen Reactions  . Metoprolol Other (See Comments)    Syncope/ passing out  . Nsaids Other (See Comments)    Pancreatitis.  Marland Kitchen Ultram [Tramadol] Itching   Prior to Admission medications   Medication Sig Start Date End Date  Taking? Authorizing Provider  AJOVY 225 MG/1.5ML SOSY Take 1 mL by mouth every 30 (thirty) days. 12/22/17  Yes [provider]  calcium carbonate (TUMS - DOSED IN MG ELEMENTAL CALCIUM) 500 MG chewable tablet Chew 1 tablet by mouth as needed for indigestion or heartburn.   Yes [provider]  cetirizine (ZYRTEC) 10 MG tablet Take 10 mg by mouth daily as needed for allergies (seasonal - spring/fall).   Yes [provider]  cyclobenzaprine (FLEXERIL) 10 MG tablet Take 1 tablet (10 mg total) by mouth at bedtime. Patient taking differently: Take 10 mg by mouth 3 (three) times daily.  05/02/15  Yes Helen Mccallum, MD  diazepam (VALIUM) 5 MG tablet Take 5 mg by mouth 2 (two) times daily.    Yes [provider]  escitalopram (LEXAPRO) 5 MG tablet Take 10 mg by mouth daily.    Yes [provider]  gabapentin (NEURONTIN) 300 MG capsule Take 300 mg by mouth 2 (two) times daily.   Yes [provider]  naloxone (NARCAN) nasal spray 4 mg/0.1 mL Place 1 spray into the nose as needed.   Yes [provider]  oxyCODONE ER (XTAMPZA ER) 27 MG C12A Take 18 tablets by mouth 2 (two) times daily.    Yes [provider]  oxyCODONE-acetaminophen (PERCOCET) 10-325 MG tablet Take 1 tablet  by mouth every 6 (six) hours as needed for pain.  12/23/17  Yes [provider]  potassium chloride SA (K-DUR,KLOR-CON) 20 MEQ tablet Take 2 tablets (40 mEq total) by mouth daily. Patient taking differently: Take 20 mEq by mouth daily.  02/14/18  Yes Helen Burgess A, FNP  QUEtiapine (SEROQUEL) 50 MG tablet Take 50 mg by mouth at bedtime.   Yes [provider]  rizatriptan (MAXALT) 5 MG tablet Take 5 mg by mouth as needed for migraine. May repeat in 2 hours if needed   Yes [provider]  torsemide (DEMADEX) 20 MG tablet Take 2 tablets (40 mg total) by mouth daily. 04/03/18 07/02/18 Yes Helen Freeze, FNP    Review of Systems  Constitutional:  Positive for appetite change (decreased) and fatigue ("better").  HENT: Negative for congestion, postnasal drip and sore throat.   Eyes: Negative.   Respiratory: Positive for shortness of breath (with minimal exertion). Negative for cough and chest tightness.   Cardiovascular: Positive for leg swelling (minimal). Negative for chest pain and palpitations.  Gastrointestinal: Positive for nausea and vomiting (at times). Negative for abdominal distention and abdominal pain.  Endocrine: Negative.   Genitourinary: Negative.   Musculoskeletal: Positive for back pain (chronic) and neck pain.  Skin: Negative.   Allergic/Immunologic: Negative.   Neurological: Negative for dizziness, syncope, weakness, light-headedness and headaches.  Hematological: Negative for adenopathy. Does not bruise/bleed easily.  Psychiatric/Behavioral: Positive for dysphoric mood (sees [facility]). Negative for sleep disturbance and suicidal ideas. The patient is nervous/anxious.    Vitals:   04/26/18 1551  BP: 127/80  Pulse: 81  Resp: 18  SpO2: 98%  Weight: 180 lb 4 oz (81.8 kg)  Height: 5\' 7"  (1.702 m)   Wt Readings from Last 3 Encounters:  04/26/18 180 lb 4 oz (81.8 kg)  04/10/18 181 lb 2 oz (82.2 kg)  04/03/18 189 lb 8 oz (86 kg)   Lab Results  Component Value Date   CREATININE 0.71 04/10/2018   CREATININE 0.69 04/03/2018   CREATININE 0.56 03/30/2018    Physical Exam  Constitutional: She is oriented to person, place, and time. She appears well-developed and well-nourished.  HENT:  Head: Normocephalic and atraumatic.  Neck: Normal range of motion. Neck supple. No JVD present.  Cardiovascular: Normal rate and regular rhythm.  Pulmonary/Chest: Effort normal. No respiratory distress. She has no wheezes. She has no rales.  Abdominal: Soft. She exhibits no distension. There is no tenderness.  Musculoskeletal: She exhibits no tenderness.       Right lower leg: She exhibits edema (trace pitting).  She exhibits no tenderness.       Left lower leg: She exhibits edema (trace pitting). She exhibits no tenderness.  Neurological: She is alert and oriented to person, place, and time.  Skin: Skin is warm and dry.  Psychiatric: She has a normal mood and affect. Her behavior is normal.  Nursing note and vitals reviewed.  Assessment & Plan:  1: Chronic heart failure with preserved ejection fraction- - NYHA class II - euvolemic today - weighing daily; Reinforced the importance of calling for an overnight weight gain of >2 pounds or a weekly weight gain of >5 pounds - weight down 1 pound since she was last here 2 weeks ago - even though she was unable to get metolazone, will get a BMP today to decide whether she can take it or not - not adding salt to her food and has been reading food labels. Reviewed the  importance of closely following a 2000mg  sodium diet - low sodium cookbook given to her - had appointment with cardiology ([facility]) 03/07/18 but patient did not show so this was rescheduled for 05/23/18 - had ASD repaired at the age of 10  - BNP 03/30/18 was 16.0  2: HTN-  - BP looks good today - saw PCP Hyacinth Meeker) 11/30/16 but is planning on getting established elsewhere. Emphasized the importance of getting established with a PCP - BMP 04/10/18 reviewed and showed sodium 139, potassium 3.9, creatinine 0.71 and GFR >60  3: Panic attacks- - she feels like her anxiety is getting under control with counseling and medications - HR has declined with longer duration of treatment for her panic attacks - mom died when she was 43 years old, dad died 2 years ago 2015/12/24), trusted medical provider died 2 months after her dad, brother hung himself 6 24-Dec-2011) years ago, 60 y/o son is living with her but isn't helping out around the house and her work responsibilities are extremely demanding and she's having trouble concentrating enough to do the job well. (has to process 27 insurance claims completely and 100%  correct/ hour) - adamantly denies suicidal thoughts - is hoping that she can return to work in a couple of weeks; she may need to start out with limited hours  Patient did not bring her medications nor a list. Each medication was verbally reviewed with the patient and she was encouraged to bring the bottles to every visit to confirm accuracy of list.  Return in 2 weeks or sooner for any questions/problems before then.

## 2018-04-26 NOTE — Patient Instructions (Signed)
Continue weighing daily and call for an overnight weight gain of > 2 pounds or a weekly weight gain of >5 pounds. 

## 2018-04-28 ENCOUNTER — Encounter: Payer: Self-pay | Admitting: Family

## 2018-05-02 ENCOUNTER — Ambulatory Visit: Payer: Self-pay | Admitting: Family

## 2018-05-03 ENCOUNTER — Telehealth: Payer: Self-pay | Admitting: Family

## 2018-05-03 NOTE — Telephone Encounter (Signed)
Patient called to say that she has noticed that the tops of her feet and her toes have been more swollen over the last 5 days than they had previously been. She does admit to being on her feet "a lot more" this past week and she was wondering if that could be contributing to the swelling on the top of her feet. She says that she recently stood at the stove for ~ 1 hour cooking and didn't sit down any during that time.   She says that her weight fluctuates a pound or two but hasn't gone over 2 pounds overnight. Doesn't feel like her breathing is any worse. Trying to watch sodium content carefully.   Explained that she could be having some swelling in response to the extra walking/ standing that she has been doing. Encouraged her to elevate her legs, apply ice to help with any inflammation and to rest more often.   Stressed the importance of her getting established with a PCP for future issues that may come up and this would be a good time to get established somewhere while she is currently out of work. Patient says that she will work on that.

## 2018-05-10 NOTE — Progress Notes (Addendum)
Patient ID: Helen Burgess, female    DOB: 1976-06-15, 42 y.o.   MRN: [id number]  HPI  Ms Kooi is a 42 y/o female with a history of HTN, panic attacks, migraines, atrial septal defect and chronic heart failure.   Echo report from 01/18/18 reviewed and showed an EF of 55-65%. Echo report from 03/30/15 reviewed and showed an EF of 55-65%.   Was in the ED 01/05/18 due to pedal edema where she was treated with lasix and released. She was in the ED 10/22/17 due to atypical chest pain and panic attack where she was evaluated and released.   She presents today for her follow-up visit with a chief complaint of minimal shortness of breath upon moderate exertion. She describes this as being present for several months. She has associated fatigue, wheezing, minimal edema, worsening anxiety and depression along with this. She denies any weight gain, abdominal distention, palpitations, chest pain, dizziness or weakness. She says that her anxiety and stress have worsened where she isn't sleeping well because she feels like she's being stalked by her landlord and says that she can't focus or concentrate because she doesn't feel secure in her current home. She now sees that her edema/ HF symptoms are directly related to her mental state as she's noticed that as her stress and anxiety have worsened, her HR and BP are again rising.   Past Medical History:  Diagnosis Date  . ASD (atrial septal defect)   . Bowel obstruction (HCC)   . CHF (congestive heart failure) (HCC)   . Dermatillomania   . Hx SBO   . Migraines   . Occipital neuralgia   . Panic attacks    Past Surgical History:  Procedure Laterality Date  . ABDOMINAL SURGERY    . CHOLECYSTECTOMY    . 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 Diabetes Neg Hx    Social History   Tobacco Use  . Smoking status: Never Smoker  . Smokeless tobacco: Never Used  Substance  Use Topics  . Alcohol use: No   Allergies  Allergen Reactions  . Metoprolol Other (See Comments)    Syncope/ passing out  . Nsaids Other (See Comments)    Pancreatitis.  Marland Kitchen Ultram [Tramadol] Itching   Prior to Admission medications   Medication Sig Start Date End Date Taking? Authorizing Provider  AJOVY 225 MG/1.5ML SOSY Take 1 mL by mouth every 30 (thirty) days. 12/22/17  Yes [provider]  calcium carbonate (TUMS - DOSED IN MG ELEMENTAL CALCIUM) 500 MG chewable tablet Chew 1 tablet by mouth as needed for indigestion or heartburn.   Yes [provider]  cetirizine (ZYRTEC) 10 MG tablet Take 10 mg by mouth daily as needed for allergies (seasonal - spring/fall).   Yes [provider]  cyclobenzaprine (FLEXERIL) 10 MG tablet Take 1 tablet (10 mg total) by mouth at bedtime. Patient taking differently: Take 10 mg by mouth 3 (three) times daily.  05/02/15  Yes [name], MD  diazepam (VALIUM) 5 MG tablet Take 5 mg by mouth 2 (two) times daily.    Yes [provider]  escitalopram (LEXAPRO) 5 MG tablet Take 10 mg by mouth daily.    Yes [provider]  gabapentin (NEURONTIN) 300 MG capsule Take 300 mg by mouth 2 (two) times daily.   Yes [provider]  naloxone [facility]) nasal  spray 4 mg/0.1 mL Place 1 spray into the nose as needed.   Yes [provider]  oxyCODONE ER (XTAMPZA ER) 27 MG C12A Take 18 tablets by mouth 2 (two) times daily.    Yes [provider]  oxyCODONE-acetaminophen (PERCOCET) 10-325 MG tablet Take 1 tablet by mouth every 6 (six) hours as needed for pain.  12/23/17  Yes [provider]  QUEtiapine (SEROQUEL) 50 MG tablet Take 50 mg by mouth at bedtime.   Yes [provider]  rizatriptan (MAXALT) 5 MG tablet Take 5 mg by mouth as needed for migraine. May repeat in 2 hours if needed   Yes [provider]  spironolactone (ALDACTONE) 25 MG tablet Take 1 tablet (25 mg total) by mouth  daily. 04/26/18 07/25/18 Yes Abrahan Fulmore, Inetta Fermo A, FNP  torsemide (DEMADEX) 20 MG tablet Take 2 tablets (40 mg total) by mouth daily. 05/11/18 08/09/18  Delma Freeze, FNP    Review of Systems  Constitutional: Positive for appetite change (decreased) and fatigue ("better").  HENT: Negative for congestion, postnasal drip and sore throat.   Eyes: Negative.   Respiratory: Positive for shortness of breath (with minimal exertion) and wheezing. Negative for cough and chest tightness.   Cardiovascular: Positive for leg swelling (minimal). Negative for chest pain and palpitations.  Gastrointestinal: Negative for abdominal distention, abdominal pain, nausea and vomiting.  Endocrine: Negative.   Genitourinary: Negative.   Musculoskeletal: Positive for back pain (chronic) and neck pain.  Skin: Negative.   Allergic/Immunologic: Negative.   Neurological: Negative for dizziness, syncope, weakness, light-headedness and headaches.  Hematological: Negative for adenopathy. Does not bruise/bleed easily.  Psychiatric/Behavioral: Positive for dysphoric mood (worsening; sees [place]). Negative for sleep disturbance and suicidal ideas. The patient is nervous/anxious (worsening).    Vitals:   05/11/18 1346  BP: (!) 154/97  Pulse: (!) 112  Resp: 18  SpO2: 99%  Weight: 173 lb 8 oz (78.7 kg)  Height: 5\' 7"  (1.702 m)   Wt Readings from Last 3 Encounters:  05/11/18 173 lb 8 oz (78.7 kg)  04/26/18 180 lb 4 oz (81.8 kg)  04/10/18 181 lb 2 oz (82.2 kg)   Lab Results  Component Value Date   CREATININE 0.66 04/26/2018   CREATININE 0.71 04/10/2018   CREATININE 0.69 04/03/2018    Physical Exam  Constitutional: She is oriented to person, place, and time. She appears well-developed and well-nourished.  HENT:  Head: Normocephalic and atraumatic.  Neck: Normal range of motion. Neck supple. No JVD present.  Cardiovascular: Normal rate and regular rhythm.  Pulmonary/Chest: Effort normal. No  respiratory distress. She has no wheezes. She has no rales.  Abdominal: Soft. She exhibits no distension. There is no tenderness.  Musculoskeletal: She exhibits no tenderness.       Right lower leg: She exhibits edema (trace pitting). She exhibits no tenderness.       Left lower leg: She exhibits edema (trace pitting). She exhibits no tenderness.  Neurological: She is alert and oriented to person, place, and time.  Skin: Skin is warm and dry.  Psychiatric: Her mood appears anxious. Her affect is angry. Her speech is rapid and/or pressured. She is agitated. She exhibits a depressed mood. She expresses no suicidal ideation.  Nursing note and vitals reviewed.  Assessment & Plan:  1: Chronic heart failure with preserved ejection fraction- - NYHA class II - euvolemic today - weighing daily; Reinforced the importance of calling for an overnight weight gain of >2 pounds or a weekly  weight gain of >5 pounds - weight down 7 pounds in the last 2 weeks  - not adding salt to her food and has been reading food labels. Reviewed the importance of closely following a 2000mg  sodium diet - had ASD repaired at the age of 42  - had appointment with cardiology ([facility]) 03/07/18 but patient did not show so this was rescheduled for 05/23/18 - BNP 03/30/18 was 16.0  2: HTN-  - BP mildly elevated today and is worsening with increased stress/anxiety - saw PCP [name] 11/30/16 but is planning on getting established elsewhere. Emphasized the importance of getting established with a PCP - BMP 04/26/18 reviewed and showed sodium 139, potassium 2.8, creatinine 0.66 and GFR >60  3: Panic attacks- - she feels like her anxiety/ stress is worsening as she feels like her landlord isn't treating her fairly and she says that she feels like she's being stalked. Her son left the home and went back to live with his dad so she is now alone.  - HR has risen again which could potentially make her HF symptoms worse as her stress/anxiety  have increased - patient is agitated, very tearful with rapid speech in the office today. She says that she is going to get a new therapist/counselor because she doesn't feel like her current one is listening to her - offered [facility] services and she says that she will walk in to [facility] first thing tomorrow morning for an evaluation - absolutely denies any suicidal thoughts/plans - mom died when she was 42 years old, dad died 2 years ago (2017), trusted medical provider died 2 months after her dad, brother hung himself 6 (2013) years ago and her work responsibilities are extremely demanding and she's having trouble concentrating enough to do the job well. 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 Each medication was verbally reviewed with the patient and she was encouraged to bring the bottles to every visit to confirm accuracy of list.  Return in 2 weeks or sooner for any questions/problems before then.

## 2018-05-11 ENCOUNTER — Encounter
Admission: RE | Admit: 2018-05-11 | Discharge: 2018-05-11 | Disposition: A | Discharge status: Home / Self Care | Payer: 59 | Source: Ambulatory Visit | Attending: Family | Admitting: Family

## 2018-05-11 ENCOUNTER — Telehealth: Payer: Self-pay | Admitting: Family

## 2018-05-11 ENCOUNTER — Encounter: Payer: Self-pay | Admitting: Family

## 2018-05-11 ENCOUNTER — Ambulatory Visit: Payer: 59 | Admitting: Family

## 2018-05-11 VITALS — BP 154/97 | HR 112 | Resp 18 | Ht 67.0 in | Wt 173.5 lb

## 2018-05-11 DIAGNOSIS — Q211 Atrial septal defect: Secondary | ICD-10-CM | POA: Insufficient documentation

## 2018-05-11 DIAGNOSIS — K56609 Unspecified intestinal obstruction, unspecified as to partial versus complete obstruction: Secondary | ICD-10-CM | POA: Diagnosis not present

## 2018-05-11 DIAGNOSIS — G43909 Migraine, unspecified, not intractable, without status migrainosus: Secondary | ICD-10-CM | POA: Diagnosis not present

## 2018-05-11 DIAGNOSIS — Z886 Allergy status to analgesic agent status: Secondary | ICD-10-CM | POA: Insufficient documentation

## 2018-05-11 DIAGNOSIS — R600 Localized edema: Secondary | ICD-10-CM | POA: Insufficient documentation

## 2018-05-11 DIAGNOSIS — Z79899 Other long term (current) drug therapy: Secondary | ICD-10-CM | POA: Insufficient documentation

## 2018-05-11 DIAGNOSIS — Z8774 Personal history of (corrected) congenital malformations of heart and circulatory system: Secondary | ICD-10-CM

## 2018-05-11 DIAGNOSIS — F410 Panic disorder [episodic paroxysmal anxiety] without agoraphobia: Secondary | ICD-10-CM | POA: Insufficient documentation

## 2018-05-11 DIAGNOSIS — M5481 Occipital neuralgia: Secondary | ICD-10-CM

## 2018-05-11 DIAGNOSIS — F329 Major depressive disorder, single episode, unspecified: Secondary | ICD-10-CM | POA: Insufficient documentation

## 2018-05-11 DIAGNOSIS — I10 Essential (primary) hypertension: Secondary | ICD-10-CM

## 2018-05-11 DIAGNOSIS — I110 Hypertensive heart disease with heart failure: Secondary | ICD-10-CM | POA: Insufficient documentation

## 2018-05-11 DIAGNOSIS — I5022 Chronic systolic (congestive) heart failure: Secondary | ICD-10-CM | POA: Diagnosis not present

## 2018-05-11 DIAGNOSIS — I509 Heart failure, unspecified: Secondary | ICD-10-CM

## 2018-05-11 DIAGNOSIS — E876 Hypokalemia: Secondary | ICD-10-CM

## 2018-05-11 DIAGNOSIS — Z01812 Encounter for preprocedural laboratory examination: Secondary | ICD-10-CM | POA: Insufficient documentation

## 2018-05-11 DIAGNOSIS — R0602 Shortness of breath: Secondary | ICD-10-CM | POA: Insufficient documentation

## 2018-05-11 DIAGNOSIS — I5032 Chronic diastolic (congestive) heart failure: Secondary | ICD-10-CM

## 2018-05-11 LAB — BASIC METABOLIC PANEL
Anion gap: 11 (ref 5–15)
BUN: 9 mg/dL (ref 6–20)
CHLORIDE: 97 mmol/L — AB (ref 98–111)
CO2: 30 mmol/L (ref 22–32)
CREATININE: 0.81 mg/dL (ref 0.44–1.00)
Calcium: 9.2 mg/dL (ref 8.9–10.3)
GFR calc non Af Amer: >60 mL/min (ref >60)
Glucose, Bld: 125 mg/dL — ABNORMAL HIGH (ref 70–99)
Potassium: 3 mmol/L — ABNORMAL LOW (ref 3.5–5.1)
Sodium: 138 mmol/L (ref 135–145)

## 2018-05-11 MED ORDER — TORSEMIDE 20 MG PO TABS: 20.0000 mg | ORAL_TABLET | Freq: Every day | ORAL | 3 refills | Status: DC

## 2018-05-11 NOTE — Patient Instructions (Addendum)
Continue weighing daily and call for an overnight weight gain of > 2 pounds or a weekly weight gain of >5 pounds.  [facility]  [address]  [place] [address] PH: [phone]  Evaluations WALK IN ONLY  Monday, Wednesday, Friday    8:30 am -3 pm  

## 2018-05-11 NOTE — Telephone Encounter (Signed)
Spoke with patient regarding lab results obtained today (05/11/18). Potassium has improved slightly to 3.0 since taking spironolactone. Potassium supplements were making her nauseous. Renal function looks great.  Advised patient to decrease her torsemide down to 20mg  daily and will recheck her lab work at her next appointment on 05/22/18. Patient verbalized understanding.

## 2018-05-12 ENCOUNTER — Encounter: Payer: Self-pay | Admitting: Family

## 2018-05-20 NOTE — Progress Notes (Deleted)
Patient ID: Helen Burgess, female    DOB: December 02, 1975, 42 y.o.   MRN: [id number]  HPI  Ms Berrocal is a 42 y/o female with a history of HTN, panic attacks, migraines, atrial septal defect and chronic heart failure.   Echo report from 01/18/18 reviewed and showed an EF of 55-65%. Echo report from 03/30/15 reviewed and showed an EF of 55-65%.   Was in the ED 01/05/18 due to pedal edema where she was treated with lasix and released. She was in the ED 10/22/17 due to atypical chest pain and panic attack where she was evaluated and released.   She presents today for her follow-up visit with a chief complaint of   Past Medical History:  Diagnosis Date  . ASD (atrial septal defect)   . Bowel obstruction (HCC)   . CHF (congestive heart failure) (HCC)   . Dermatillomania   . Hx SBO   . Migraines   . Occipital neuralgia   . Panic attacks    Past Surgical History:  Procedure Laterality Date  . ABDOMINAL SURGERY    . CHOLECYSTECTOMY    . 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 Diabetes Neg Hx    Social History   Tobacco Use  . Smoking status: Never Smoker  . Smokeless tobacco: Never Used  Substance Use Topics  . Alcohol use: No   Allergies  Allergen Reactions  . Metoprolol Other (See Comments)    Syncope/ passing out  . Nsaids Other (See Comments)    Pancreatitis.  Marland Kitchen Ultram [Tramadol] Itching     Review of Systems  Constitutional: Positive for appetite change (decreased) and fatigue ("better").  HENT: Negative for congestion, postnasal drip and sore throat.   Eyes: Negative.   Respiratory: Positive for shortness of breath (with minimal exertion) and wheezing. Negative for cough and chest tightness.   Cardiovascular: Positive for leg swelling (minimal). Negative for chest pain and palpitations.  Gastrointestinal: Negative for abdominal distention, abdominal pain, nausea and vomiting.   Endocrine: Negative.   Genitourinary: Negative.   Musculoskeletal: Positive for back pain (chronic) and neck pain.  Skin: Negative.   Allergic/Immunologic: Negative.   Neurological: Negative for dizziness, syncope, weakness, light-headedness and headaches.  Hematological: Negative for adenopathy. Does not bruise/bleed easily.  Psychiatric/Behavioral: Positive for dysphoric mood (worsening; sees [place]). Negative for sleep disturbance and suicidal ideas. The patient is nervous/anxious (worsening).      Physical Exam  Constitutional: She is oriented to person, place, and time. She appears well-developed and well-nourished.  HENT:  Head: Normocephalic and atraumatic.  Neck: Normal range of motion. Neck supple. No JVD present.  Cardiovascular: Normal rate and regular rhythm.  Pulmonary/Chest: Effort normal. No respiratory distress. She has no wheezes. She has no rales.  Abdominal: Soft. She exhibits no distension. There is no tenderness.  Musculoskeletal: She exhibits no tenderness.       Right lower leg: She exhibits edema (trace pitting). She exhibits no tenderness.       Left lower leg: She exhibits edema (trace pitting). She exhibits no tenderness.  Neurological: She is alert and oriented to person, place, and time.  Skin: Skin is warm and dry.  Psychiatric: Her mood appears anxious. Her affect is angry. Her speech is rapid and/or pressured. She is agitated. She exhibits a depressed mood. She expresses no suicidal ideation.  Nursing note and vitals  reviewed.  Assessment & Plan:  1: Chronic heart failure with preserved ejection fraction- - NYHA class II - euvolemic today - weighing daily; Reinforced the importance of calling for an overnight weight gain of >2 pounds or a weekly weight gain of >5 pounds - weight  - not adding salt to her food and has been reading food labels. Reviewed the importance of closely following a 2000mg  sodium diet - had ASD repaired at  the age of 12  - had appointment with cardiology ([facility]) 03/07/18 but patient did not show so this was rescheduled for 05/23/18 - BNP 03/30/18 was 16.0  2: HTN-  - BP  - saw PCP Hyacinth Meeker) 11/30/16 but is planning on getting established elsewhere. Emphasized the importance of getting established with a PCP - BMP 05/11/18 reviewed and showed sodium 138, potassium 3.0, creatinine 0.81 and GFR >60  3: Panic attacks- - she feels like her anxiety/ stress is worsening as she feels like her landlord isn't treating her fairly and she says that she feels like she's being stalked. Her son left the home and went back to live with his dad so she is now alone.  - HR has risen again which could potentially make her HF symptoms worse as her stress/anxiety have increased - patient is agitated, very tearful with rapid speech in the office today. She says that she is going to get a new therapist/counselor because she doesn't feel like her current one is listening to her - offered [facility] services and she says that she will walk in to [facility] first thing tomorrow morning for an evaluation - absolutely denies any suicidal thoughts/plans - mom died when she was 65 years old, dad died 2 years ago 12-31-2015), trusted medical provider died 2 months after her dad, brother hung himself 6 12-31-11) years ago and her work responsibilities are extremely demanding and she's having trouble concentrating enough to do the job well. (has to process 27 insurance claims completely and 100% correct/ hour)  4: Hypokalemia- - will get BMP today to recheck potassium level  Patient did not bring her medications nor a list. Each medication was verbally reviewed with the patient and she was encouraged to bring the bottles to every visit to confirm accuracy of list.

## 2018-05-22 ENCOUNTER — Ambulatory Visit: Payer: Self-pay | Admitting: Family

## 2018-05-23 ENCOUNTER — Ambulatory Visit: Payer: Self-pay | Admitting: Physician Assistant

## 2018-05-31 ENCOUNTER — Telehealth: Payer: Self-pay

## 2018-05-31 ENCOUNTER — Encounter: Payer: Self-pay | Admitting: Emergency Medicine

## 2018-05-31 ENCOUNTER — Emergency Department: Payer: 59

## 2018-05-31 ENCOUNTER — Emergency Department
Admission: EM | Admit: 2018-05-31 | Discharge: 2018-05-31 | Disposition: A | Discharge status: Home / Self Care | Payer: 59 | Attending: Emergency Medicine | Admitting: Emergency Medicine

## 2018-05-31 ENCOUNTER — Telehealth: Payer: Self-pay | Admitting: Family

## 2018-05-31 ENCOUNTER — Other Ambulatory Visit: Payer: Self-pay

## 2018-05-31 DIAGNOSIS — R55 Syncope and collapse: Secondary | ICD-10-CM | POA: Diagnosis present

## 2018-05-31 DIAGNOSIS — I5032 Chronic diastolic (congestive) heart failure: Secondary | ICD-10-CM | POA: Insufficient documentation

## 2018-05-31 DIAGNOSIS — Z79899 Other long term (current) drug therapy: Secondary | ICD-10-CM | POA: Insufficient documentation

## 2018-05-31 DIAGNOSIS — I5033 Acute on chronic diastolic (congestive) heart failure: Secondary | ICD-10-CM | POA: Diagnosis not present

## 2018-05-31 DIAGNOSIS — I110 Hypertensive heart disease with heart failure: Secondary | ICD-10-CM | POA: Insufficient documentation

## 2018-05-31 DIAGNOSIS — I509 Heart failure, unspecified: Secondary | ICD-10-CM

## 2018-05-31 LAB — URINALYSIS, COMPLETE (UACMP) WITH MICROSCOPIC
BILIRUBIN URINE: NEGATIVE
Bacteria, UA: NONE SEEN
Broad Casts, UA: 4
Glucose, UA: NEGATIVE mg/dL
Hgb urine dipstick: NEGATIVE
Ketones, ur: NEGATIVE mg/dL
Leukocytes, UA: NEGATIVE
Nitrite: NEGATIVE
PROTEIN: NEGATIVE mg/dL
SPECIFIC GRAVITY, URINE: 1.008 (ref 1.005–1.030)
pH: 6 (ref 5.0–8.0)

## 2018-05-31 LAB — MAGNESIUM: Magnesium: 1.7 mg/dL (ref 1.7–2.4)

## 2018-05-31 LAB — CBC
HCT: 36.1 % (ref 36.0–46.0)
Hemoglobin: 11.6 g/dL — ABNORMAL LOW (ref 12.0–15.0)
MCH: 27.3 pg (ref 26.0–34.0)
MCHC: 32.1 g/dL (ref 30.0–36.0)
MCV: 84.9 fL (ref 80.0–100.0)
PLATELETS: 323 10*3/uL (ref 150–400)
RBC: 4.25 MIL/uL (ref 3.87–5.11)
RDW: 13.4 % (ref 11.5–15.5)
WBC: 7.7 10*3/uL (ref 4.0–10.5)
nRBC: 0 % (ref 0.0–0.2)

## 2018-05-31 LAB — BASIC METABOLIC PANEL
Anion gap: 11 (ref 5–15)
BUN: 9 mg/dL (ref 6–20)
CALCIUM: 8.8 mg/dL — AB (ref 8.9–10.3)
CO2: 30 mmol/L (ref 22–32)
Chloride: 97 mmol/L — ABNORMAL LOW (ref 98–111)
Creatinine, Ser: 0.83 mg/dL (ref 0.44–1.00)
GFR calc Af Amer: >60 mL/min (ref >60)
GLUCOSE: 138 mg/dL — AB (ref 70–99)
Potassium: 3.3 mmol/L — ABNORMAL LOW (ref 3.5–5.1)
Sodium: 138 mmol/L (ref 135–145)

## 2018-05-31 LAB — TROPONIN I

## 2018-05-31 LAB — POCT PREGNANCY, URINE: Preg Test, Ur: NEGATIVE

## 2018-05-31 MED ORDER — IOPAMIDOL (ISOVUE-370) INJECTION 76%: 75.0000 mL | Freq: Once | INTRAVENOUS | Status: DC | PRN

## 2018-05-31 MED ORDER — FUROSEMIDE 10 MG/ML IJ SOLN
20.0000 mg | Freq: Once | INTRAMUSCULAR | Status: AC
  Administered 2018-05-31: 20 mg via INTRAVENOUS
  Filled 2018-05-31: qty 4

## 2018-05-31 MED ORDER — FUROSEMIDE 10 MG/ML IJ SOLN: 20.0000 mg | Freq: Once | INTRAMUSCULAR | Status: DC

## 2018-05-31 MED ORDER — IOHEXOL 350 MG/ML SOLN
75.0000 mL | Freq: Once | INTRAVENOUS | Status: AC | PRN
  Administered 2018-05-31: 75 mL via INTRAVENOUS

## 2018-05-31 MED ORDER — POTASSIUM CHLORIDE CRYS ER 20 MEQ PO TBCR
40.0000 meq | EXTENDED_RELEASE_TABLET | Freq: Once | ORAL | Status: AC
  Administered 2018-05-31: 40 meq via ORAL
  Filled 2018-05-31: qty 2

## 2018-05-31 NOTE — Telephone Encounter (Signed)
Spoke with [name] FNP who advised that due to multiple syncope episodes patient needs to be seen in the ED for evaluation.    Helen Burgess advised she needs to report to the ED this morning for evaluation.  She states she will have to find a ride, but will try to get there.

## 2018-05-31 NOTE — ED Notes (Signed)
Attempted IV access 2X with no success. CT at bedside attempting IV

## 2018-05-31 NOTE — Telephone Encounter (Signed)
Helen Burgess called and left a message stating that since October 9th she has started passing out again. She states that since then she has passed out daily since then sometimes 4-5 times a day. It was also noted that she started back at work on 05/24/2018.  She states that she has checked her blood pressure and that it has looked good and her heart rate is also within normal range. She does report that her weight has increase significantly but did not provide the numbers. She states that her legs and feet are swollen and sore as well. She did obtain TED hose and has been using them. She also reports that she increased her fluid tablet to twice daily due to the swelling. She would like to go back to see the Cardiologist at [facility] as they had done her surgery on her heart in the past.

## 2018-05-31 NOTE — ED Provider Notes (Signed)
[facility] Emergency Department Provider Note  ____________________________________________  Time seen: Approximately 3:49 PM  I have reviewed the triage vital signs and the nursing notes.   HISTORY  Chief Complaint Loss of Consciousness   HPI Helen Burgess is a 42 y.o. female with a history of ASD status post repair, CHF, anxiety who presents for syncope.  Patient reports over the last 8 days she has had several daily episodes of syncope.  She reports no warning symptoms such as dizziness, nausea, chest pain or shortness of breath.  She reports that most episodes happen at night while she is walking to the bathroom but some have happened during the day.  She reports that she has fallen several times and hit her head.  She called her cardiologist's office and was told to come to the emergency room.  She is noted a 10 pound weight gain over the last week with bilateral lower extremity swelling.  She is on 20 of torsemide however she did increase her dose to 20 twice daily over the last 3 days with no relief.  She has had mild dyspnea with exertion but no shortness of breath at rest.  She denies any personal or family history of blood clots, recent travel immobilization, hemoptysis, or exogenous hormones.  Past Medical History:  Diagnosis Date  . ASD (atrial septal defect)   . Bowel obstruction (HCC)   . CHF (congestive heart failure) (HCC)   . Dermatillomania   . Hx SBO   . Migraines   . Occipital neuralgia   . Panic attacks     Patient Active Problem List   Diagnosis Date Noted  . Syncope 03/30/2018  . Lymphedema 03/30/2018  . Hypokalemia 03/21/2018  . Chronic diastolic heart failure (HCC) 01/06/2018  . HTN (hypertension) 01/06/2018  . Panic attacks 01/06/2018  . Acute pancreatitis 03/28/2015    Past Surgical History:  Procedure Laterality Date  . ABDOMINAL SURGERY    . CHOLECYSTECTOMY    . 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 Authorizing Provider  AJOVY 225 MG/1.5ML SOSY Take 1 mL by mouth every 30 (thirty) days. 12/22/17   [provider]  calcium carbonate (TUMS - DOSED IN MG ELEMENTAL CALCIUM) 500 MG chewable tablet Chew 1 tablet by mouth as needed for indigestion or heartburn.    [provider]  cetirizine (ZYRTEC) 10 MG tablet Take 10 mg by mouth daily as needed for allergies (seasonal - spring/fall).    [provider]  cyclobenzaprine (FLEXERIL) 10 MG tablet Take 1 tablet (10 mg total) by mouth at bedtime. Patient taking differently: Take 10 mg by mouth 3 (three) times daily.  05/02/15   [name], MD  diazepam (VALIUM) 5 MG tablet Take 5 mg by mouth 2 (two) times daily.     [provider]  escitalopram (LEXAPRO) 5 MG tablet Take 10 mg by mouth daily.     [provider]  gabapentin (NEURONTIN) 300 MG capsule Take 300 mg by mouth 2 (two) times daily.    [provider]  naloxone [facility]) nasal spray 4 mg/0.1 mL Place 1 spray into the nose as needed.    [provider]  oxyCODONE ER (XTAMPZA ER) 27 MG C12A Take 18 tablets by mouth 2 (two) times daily.     [provider]  oxyCODONE-acetaminophen (PERCOCET)  10-325 MG tablet Take 1 tablet by mouth every 6 (six) hours as needed for pain.  12/23/17   [provider]  QUEtiapine (SEROQUEL) 50 MG tablet Take 50 mg by mouth at bedtime.    [provider]  rizatriptan (MAXALT) 5 MG tablet Take 5 mg by mouth as needed for migraine. May repeat in 2 hours if needed    [provider]  spironolactone (ALDACTONE) 25 MG tablet Take 1 tablet (25 mg total) by mouth daily. 04/26/18 07/25/18  Delma Freeze, FNP  torsemide (DEMADEX) 20 MG tablet Take 1 tablet (20 mg total) by mouth daily. 05/11/18 08/09/18  Delma Freeze, FNP    Allergies Metoprolol; Nsaids; and Ultram  [tramadol]  Family History  Problem Relation Age of Onset  . Diabetes Neg Hx     Social History Social History   Tobacco Use  . Smoking status: Never Smoker  . Smokeless tobacco: Never Used  Substance Use Topics  . Alcohol use: No  . Drug use: No    Review of Systems  Constitutional: Negative for fever. + syncope Eyes: Negative for visual changes. ENT: Negative for sore throat. Neck: No neck pain  Cardiovascular: Negative for chest pain. Respiratory: Negative for shortness of breath. + DOE Gastrointestinal: Negative for abdominal pain, vomiting or diarrhea. Genitourinary: Negative for dysuria. Musculoskeletal: Negative for back pain. + b/l leg swelling Skin: Negative for rash. Neurological: Negative for headaches, weakness or numbness. Psych: No SI or HI  ____________________________________________   PHYSICAL EXAM:  VITAL SIGNS: ED Triage Vitals  Enc Vitals Group     BP 05/31/18 1426 97/61     Pulse Rate 05/31/18 1426 79     Resp 05/31/18 1426 18     Temp 05/31/18 1426 98.6 F (37 C)     Temp Source 05/31/18 1426 Oral     SpO2 05/31/18 1426 99 %     Weight 05/31/18 1427 182 lb (82.6 kg)     Height 05/31/18 1427 5\' 7"  (1.702 m)     Head Circumference --      Peak Flow --      Pain Score 05/31/18 1431 8     Pain Loc --      Pain Edu? --      Excl. in GC? --     Constitutional: Alert and oriented. Well appearing and in no apparent distress. HEENT:      Head: Normocephalic and atraumatic.         Eyes: Conjunctivae are normal. Sclera is non-icteric.       Mouth/Throat: Mucous membranes are moist.       Neck: Supple with no signs of meningismus. Cardiovascular: Regular rate and rhythm. No murmurs, gallops, or rubs. 2+ symmetrical distal pulses are present in all extremities. No JVD. Respiratory: Normal respiratory effort. Lungs are clear to auscultation bilaterally. No wheezes, crackles, or rhonchi.  Gastrointestinal: Soft, non tender, and non distended  with positive bowel sounds. No rebound or guarding. Musculoskeletal: 2+ pitting edema bilaterally Neurologic: Normal speech and language. Face is symmetric. Moving all extremities. No gross focal neurologic deficits are appreciated. Skin: Skin is warm, dry and intact. No rash noted. Psychiatric: Mood and affect are normal. Speech and behavior are normal.  ____________________________________________   LABS (all labs ordered are listed, but only abnormal results are displayed)  Labs Reviewed  BASIC METABOLIC PANEL - Abnormal; Notable for the following components:      Result Value   Potassium 3.3 (*)  Chloride 97 (*)    Glucose, Bld 138 (*)    Calcium 8.8 (*)    All other components within normal limits  CBC - Abnormal; Notable for the following components:   Hemoglobin 11.6 (*)    All other components within normal limits  URINALYSIS, COMPLETE (UACMP) WITH MICROSCOPIC - Abnormal; Notable for the following components:   Color, Urine YELLOW (*)    APPearance CLEAR (*)    All other components within normal limits  TROPONIN I  MAGNESIUM  CBG MONITORING, ED  POC URINE PREG, ED  POCT PREGNANCY, URINE   ____________________________________________  EKG  ED ECG REPORT I, Nita Sickle, the attending physician, personally viewed and interpreted this ECG.  Normal sinus rhythm, normal intervals, normal axis, no STE or depressions, TWI in inferior and lateral leads, no evidence of HOCM, AV block, delta wave, ARVD, prolonged QTc, WPW, or Brugada. Unchanged from prior   ____________________________________________  RADIOLOGY  I have personally reviewed the images performed during this visit and I agree with the Radiologist's read.   Interpretation by Radiologist:  Dg Chest 2 View  Result Date: 05/31/2018 CLINICAL DATA:  Syncopal episodes every day, swelling of the feet, history of prior repair of ASD many years ago EXAM: CHEST - 2 VIEW COMPARISON:  Chest x-ray of  01/05/2018, 04/07/2015, 10/03/2008, and CT chest of 10/12/2004 FINDINGS: There are minimally prominent markings at the right lung base and developing pneumonia is a consideration. The left lung is grossly clear. No pleural effusion is seen. The air-fluid level noted within the anterior mediastinum has been present at least since the CT chest of 2006 and most likely is related to the prior surgery. It appears on the CT the the  air-fluid level rose some represents a portion of the mid transverse colon which herniates anteriorly into the anterior mediastinum, explaining the air-fluid level. Correlation with prior surgical history is recommended. Cardiomegaly is stable. No bony abnormality is seen. IMPRESSION: 1. Somewhat prominent markings particularly the right lung base may reflect early pneumonia. Consider follow-up chest x-ray. No pleural effusion. 2. Air-fluid level in the anterior mediastinum appears to represent a portion of the mid transverse colon which has been present at least since 2006 CT of the chest and may be related to the patient's history of prior surgery for esophageal abnormality. Electronically Signed   By: Dwyane Dee M.D.   On: 05/31/2018 15:15   Ct Head Wo Contrast  Result Date: 05/31/2018 CLINICAL DATA:  Patient has been falling and passing out multiple times every day since October 7. Prior esophageal surgery. EXAM: CT HEAD WITHOUT CONTRAST TECHNIQUE: Contiguous axial images were obtained from the base of the skull through the vertex without intravenous contrast. COMPARISON:  None. FINDINGS: BRAIN: The ventricles and sulci are normal. No intraparenchymal hemorrhage, mass effect nor midline shift. No acute large vascular territory infarcts. Grey-white matter distinction is maintained. The basal ganglia are unremarkable. No abnormal extra-axial fluid collections. Basal cisterns are not effaced and midline. The brainstem and cerebellar hemispheres are without acute abnormalities.  VASCULAR: No hyperdense vessels or unexpected calcifications. SKULL/SOFT TISSUES: No skull fracture. No significant soft tissue swelling. ORBITS/SINUSES: The included ocular globes and orbital contents are normal.The mastoid air cells are clear. The included paranasal sinuses are well-aerated. OTHER: None. IMPRESSION: Normal head CT. Electronically Signed   By: Tollie Eth M.D.   On: 05/31/2018 16:06   Ct Angio Chest Pe W And/or Wo Contrast  Result Date: 05/31/2018 CLINICAL DATA:  42 year old female with  dizziness EXAM: CT ANGIOGRAPHY CHEST WITH CONTRAST TECHNIQUE: Multidetector CT imaging of the chest was performed using the standard protocol during bolus administration of intravenous contrast. Multiplanar CT image reconstructions and MIPs were obtained to evaluate the vascular anatomy. CONTRAST:  <See Chart> ISOVUE-370 IOPAMIDOL (ISOVUE-370) INJECTION 76%, 75mL OMNIPAQUE IOHEXOL 350 MG/ML SOLN COMPARISON:  Abdominal CT 04/02/2015 FINDINGS: Cardiovascular: Cardiovascular: Heart: Heart size within normal limits. No pericardial fluid/thickening. No significant calcified coronary disease. Aorta: Unremarkable course, caliber, contour of the thoracic aorta. No aneurysm or dissection flap. No periaortic fluid. Pulmonary arteries: No central, lobar, segmental, or proximal subsegmental filling defects. Mediastinum/Nodes: Surgical changes of esophageal reconstruction with the esophageal conduit in the anterior mediastinum, traversing anterior to the cardiovascular structures. No inflammatory changes. No mediastinal adenopathy. Lungs/Pleura: Geographic ground-glass opacity of the lungs. No interlobular septal thickening. No pneumothorax or pleural effusion. No confluent airspace disease. No endotracheal or endobronchial debris. No bronchial wall thickening. Upper Abdomen: Surgical changes of the upper abdomen. No acute finding. Musculoskeletal: No acute displaced fracture. Review of the MIP images confirms the above  findings. IMPRESSION: Negative for pulmonary emboli. Surgical changes of esophageal reconstruction, with no acute changes. Geographic ground-glass opacity of the lungs, most commonly seen with small airway disease, less commonly small vessel disease. Electronically Signed   By: Gilmer Mor D.O.   On: 05/31/2018 16:06     ____________________________________________   PROCEDURES  Procedure(s) performed: None Procedures Critical Care performed:  None ____________________________________________   INITIAL IMPRESSION / ASSESSMENT AND PLAN / ED COURSE  42 y.o. female with a history of ASD status post repair, CHF, anxiety who presents for evaluation of several daily episodes of syncope over the last week with no preceding symptoms.  Patient endorses hitting her head several times, she is neurologically intact with no evidence of trauma on exam, no signs or symptoms of basilar skull fracture.  Orthostatic vital signs are negative.  She does look volume overloaded with 2+ pitting edema, lungs are clear to auscultation with no crackles.  EKG showing no evidence of dysrhythmias or acute ischemic changes.  Chest x-ray concerning for possible pneumonia however clinically patient has no signs of such.  Differential diagnoses including cardiac dysrhythmia especially with a history of ASD and heart surgery and no preceding symptoms for the syncopal events. Will monitor on telemetry. Will check electrolytes.  Will check troponin to rule out ischemia.  We will do a CT angiogram to rule out a PE since patient is also complaining of shortness of breath.  CT will also help evaluate the abnormality seen on chest x-ray.  I recommended admission to the hospital for further monitoring however patient reports that she is unable to stay.  I explained to her that I am concerned for cardiac dysrhythmias which can lead to sudden death versus significant morbidity especially with several syncopal episodes which can happen while  driving and can lead to significant trauma to the patient and bystanders.  Patient understands these recommendations and continues to demand to go home if labs and imaging are within normal limits.  Clinical Course as of Jun 01 1711  Wed May 31, 2018  1709 CT head negative for intracranial injury.  CT angiogram negative for PE and did not show any other abnormalities.  UA with no evidence of dehydration or UTI.  Electrolytes showing mildly low potassium which was supplemented p.o.  Patient was given a dose of IV Lasix to help diurese.  Again explained to the patient importance of being admitted for monitoring  but patient wishes to go home.  She will be discharged AGAINST MEDICAL ADVICE.  Recommend close follow-up with her cardiologist first thing in the morning.  Discussed very strict return precautions and told her not to drive or put herself in any dangerous situations since she seems to be passing out quite frequently and she can hurt herself or others around her.   [CV]    Clinical Course User Index [CV] Don Perking [place], MD   _________________________ 5:12 PM on 05/31/2018 -----------------------------------------  I personally reviewed patient's telemetry records and saw no evidence of dysrhythmias.    As part of my medical decision making, I reviewed the following data within the electronic MEDICAL RECORD NUMBER Nursing notes reviewed and incorporated, Labs reviewed , EKG interpreted , Old EKG reviewed, Old chart reviewed, Radiograph reviewed , Notes from prior ED visits and [place] Controlled Substance Database    Pertinent labs & imaging results that were available during my care of the patient were reviewed by me and considered in my medical decision making (see chart for details).    ____________________________________________   FINAL CLINICAL IMPRESSION(S) / ED DIAGNOSES  Final diagnoses:  Syncope, unspecified syncope type  Acute on chronic congestive heart failure, unspecified  heart failure type (HCC)      NEW MEDICATIONS STARTED DURING THIS VISIT:  ED Discharge Orders    None       Note:  This document was prepared using Dragon voice recognition software and may include unintentional dictation errors.    Nita Sickle, MD 05/31/18 [phone]

## 2018-05-31 NOTE — Telephone Encounter (Signed)
Spoke with patient regarding her syncopal episodes. She says that she has passed out numerous times in the last week without any preceding symptoms. She says that she just "wakes up" on the floor. She says that it's been happening during the daytime as well as at night and even while she has been at work.  She says that she's hit her head hard at least 5 times in the last week and says that she's hit her head so hard that she feel like she's been beat up when she wakes up.   No change in any of her medications. Advised patient that she should present to the ED for further evaluation. Patient says that she is currently waiting on her ride to take her to the ED. Advised her to keep [place] posted.

## 2018-05-31 NOTE — ED Triage Notes (Signed)
Patient states since October 7th she has been passing out multiple times every day.  Patient states she has been falling and hitting her head as well.  Patient has not seen a provider for this prior to today.  Patient states her feet have also been swelling.  Patient takes furosemide for CHF.  Patient goes to the heart failure clinic regularly.  Patient states she had similar episodes when she was a teenager due to ASD.  Patient states this was repaired with surgery.

## 2018-06-04 NOTE — Progress Notes (Signed)
Patient ID: Helen Burgess, female    DOB: November 06, 1975, 42 y.o.   MRN: [id number]  HPI  Helen Burgess is a 42 y/o female with a history of HTN, panic attacks, migraines, atrial septal defect and chronic heart failure.   Echo report from 01/18/18 reviewed and showed an EF of 55-65%. Echo report from 03/30/15 reviewed and showed an EF of 55-65%.   Was in the ED 05/31/18 due to syncopal episodes. Head CT and CT angiogram were negative. IV lasix was given. She was discharged against medical advise. Was in the ED 01/05/18 due to pedal edema where she was treated with lasix and released. She was in the ED 10/22/17 due to atypical chest pain and panic attack where she was evaluated and released.   She presents today for her follow-up visit with a chief complaint of moderate shortness of breath upon minimal exertion. She describes this as chronic in nature having been present for several months. She does feel like it has worsened some recently. She has associated fatigue, low-grade fever, wheezing, pedal edema, light-headedness, syncope, depression, anxiety, slight weight gain and chronic pain along with this. She denies any difficulty sleeping, abdominal distention, palpitations, cough or chest pain. Has had 1 syncopal episode since her recent trip to the ED. Has a phone call into [facility] and is waiting on a call back from them. Tried going back to work but with worsening symptoms, she had to take herself back out of work again. Currently has an appeal in process for denied dates of 9/11-10/8/19.   Past Medical History:  Diagnosis Date  . ASD (atrial septal defect)   . Bowel obstruction (HCC)   . CHF (congestive heart failure) (HCC)   . Dermatillomania   . Hx SBO   . Migraines   . Occipital neuralgia   . Panic attacks    Past Surgical History:  Procedure Laterality Date  . ABDOMINAL SURGERY    . CHOLECYSTECTOMY    . 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 Diabetes Neg Hx    Social History   Tobacco Use  . Smoking status: Never Smoker  . Smokeless tobacco: Never Used  Substance Use Topics  . Alcohol use: No   Allergies  Allergen Reactions  . Metoprolol Other (See Comments)    Syncope/ passing out  . Nsaids Other (See Comments)    Pancreatitis.  Marland Kitchen Ultram [Tramadol] Itching   Prior to Admission medications   Medication Sig Start Date End Date Taking? Authorizing Provider  AJOVY 225 MG/1.5ML SOSY Take 1 mL by mouth every 30 (thirty) days. 12/22/17  Yes [provider]  calcium carbonate (TUMS - DOSED IN MG ELEMENTAL CALCIUM) 500 MG chewable tablet Chew 1 tablet by mouth as needed for indigestion or heartburn.   Yes [provider]  cetirizine (ZYRTEC) 10 MG tablet Take 10 mg by mouth daily as needed for allergies (seasonal - spring/fall).   Yes [provider]  cyclobenzaprine (FLEXERIL) 10 MG tablet Take 1 tablet (10 mg total) by mouth at bedtime. Patient taking differently: Take 10 mg by mouth 3 (three) times daily.  05/02/15  Yes [name], MD  diazepam (VALIUM) 5 MG tablet Take 5 mg by mouth 2 (two) times daily.    Yes [provider]  escitalopram (LEXAPRO) 5 MG tablet Take 10 mg by mouth daily.  Yes [provider]  gabapentin (NEURONTIN) 300 MG capsule Take 300 mg by mouth 2 (two) times daily.   Yes [provider]  guanFACINE (TENEX) 1 MG tablet Take 1 mg by mouth at bedtime.   Yes [provider]  naloxone (NARCAN) nasal spray 4 mg/0.1 mL Place 1 spray into the nose as needed.   Yes [provider]  oxyCODONE ER (XTAMPZA ER) 27 MG C12A Take 18 tablets by mouth 2 (two) times daily.    Yes [provider]  oxyCODONE-acetaminophen (PERCOCET) 10-325 MG tablet Take 1 tablet by mouth every 6 (six) hours as needed for pain.  12/23/17  Yes [provider]  QUEtiapine (SEROQUEL) 50 MG tablet  Take 50 mg by mouth at bedtime.   Yes [provider]  rizatriptan (MAXALT) 5 MG tablet Take 5 mg by mouth as needed for migraine. May repeat in 2 hours if needed   Yes [provider]  spironolactone (ALDACTONE) 25 MG tablet Take 1 tablet (25 mg total) by mouth daily. 04/26/18 07/25/18 Yes Hackney, Inetta Fermo A, FNP  torsemide (DEMADEX) 20 MG tablet Take 1 tablet (20 mg total) by mouth daily. Patient taking differently: Take 20 mg by mouth 2 (two) times daily.  05/11/18 08/09/18 Yes Delma Freeze, FNP    Review of Systems  Constitutional: Positive for appetite change (decreased), fatigue and fever (99.5-100.5 last few days).  HENT: Negative for congestion, postnasal drip and sore throat.   Eyes: Negative.   Respiratory: Positive for shortness of breath (with minimal exertion) and wheezing (at times). Negative for cough and chest tightness.   Cardiovascular: Positive for leg swelling (minimal). Negative for chest pain and palpitations.  Gastrointestinal: Negative for abdominal distention, abdominal pain, nausea and vomiting.  Endocrine: Negative.   Genitourinary: Negative.   Musculoskeletal: Positive for back pain (chronic) and neck pain.  Skin: Negative.   Allergic/Immunologic: Negative.   Neurological: Positive for syncope and light-headedness (infrequent). Negative for dizziness, weakness and headaches.  Hematological: Negative for adenopathy. Does not bruise/bleed easily.  Psychiatric/Behavioral: Positive for dysphoric mood (seeing different psychiatrist). Negative for sleep disturbance and suicidal ideas. The patient is nervous/anxious (worsening).    Vitals:   06/05/18 1450  BP: 133/81  Pulse: 91  Resp: 18  SpO2: 97%  Weight: 176 lb 8 oz (80.1 kg)  Height: 5\' 7"  (1.702 m)   Wt Readings from Last 3 Encounters:  06/05/18 176 lb 8 oz (80.1 kg)  05/31/18 182 lb (82.6 kg)  05/11/18 173 lb 8 oz (78.7 kg)   Lab Results  Component Value Date   CREATININE 0.83  05/31/2018   CREATININE 0.81 05/11/2018   CREATININE 0.66 04/26/2018   Physical Exam  Constitutional: She is oriented to person, place, and time. She appears well-developed and well-nourished.  HENT:  Head: Normocephalic and atraumatic.  Neck: Normal range of motion. Neck supple. No JVD present.  Cardiovascular: Normal rate and regular rhythm.  Pulmonary/Chest: Effort normal. No respiratory distress. She has no wheezes. She has no rales.  Abdominal: Soft. She exhibits no distension. There is no tenderness.  Musculoskeletal: She exhibits no tenderness.       Right lower leg: She exhibits edema (trace pitting). She exhibits no tenderness.       Left lower leg: She exhibits edema (trace pitting). She exhibits no tenderness.  Neurological: She is alert and oriented to person, place, and time.  Skin: Skin is warm and dry.  Psychiatric: Her mood appears anxious. She exhibits a depressed  mood. She expresses no suicidal ideation.  Nursing note and vitals reviewed.  Assessment & Plan:  1: Chronic heart failure with preserved ejection fraction- - NYHA class III - euvolemic today; minimal edema - weighing daily; Reinforced the importance of calling for an overnight weight gain of >2 pounds or a weekly weight gain of >5 pounds - weight up 3 pounds from last visit here 3 weeks ago - wearing support socks with improvement in edema; reminded to remove that at bedtime - was taking torsemide 20mg  BID and instructed her to return back to 20mg  daily - not adding salt to her food and has been reading food labels. Reviewed the importance of closely following a 2000mg  sodium diet - had ASD repaired at the age of 41 at [facility] - waiting on return call back from [facility] - BNP 03/30/18 was 16.0  2: HTN-  - BP looks good today - saw PCP Hyacinth Meeker) 11/30/16 but is planning on getting established elsewhere. Emphasized the importance of getting established with a PCP - BMP 05/31/18 reviewed and showed  sodium 138, potassium 3.3, creatinine 0.83 and GFR >60  3: Panic attacks/ PTSD- - is now seeing a new psychiatrist and is encouraged by her interaction with her - in the process of getting new counselor established  - HR back up in the 90's which could potentially make her HF symptoms worse as her stress/anxiety have increased - mom died when she was 31 years old, dad died 2 years ago 07-Jan-2016), trusted medical provider died 2 months after her dad, brother hung himself 6 07-Jan-2012) years ago and her work responsibilities are extremely demanding and she's having trouble concentrating enough to do the job well. 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 Each medication was verbally reviewed with the patient and she was encouraged to bring the bottles to every visit to confirm accuracy of list.  Return in 1 month or sooner for any questions/problems before then.

## 2018-06-05 ENCOUNTER — Encounter: Payer: Self-pay | Admitting: Family

## 2018-06-05 ENCOUNTER — Ambulatory Visit: Payer: 59 | Attending: Family | Admitting: Family

## 2018-06-05 VITALS — BP 133/81 | HR 91 | Resp 18 | Ht 67.0 in | Wt 176.5 lb

## 2018-06-05 DIAGNOSIS — Z886 Allergy status to analgesic agent status: Secondary | ICD-10-CM | POA: Insufficient documentation

## 2018-06-05 DIAGNOSIS — Z9049 Acquired absence of other specified parts of digestive tract: Secondary | ICD-10-CM | POA: Diagnosis not present

## 2018-06-05 DIAGNOSIS — F410 Panic disorder [episodic paroxysmal anxiety] without agoraphobia: Secondary | ICD-10-CM | POA: Diagnosis not present

## 2018-06-05 DIAGNOSIS — Z888 Allergy status to other drugs, medicaments and biological substances status: Secondary | ICD-10-CM | POA: Insufficient documentation

## 2018-06-05 DIAGNOSIS — Z79899 Other long term (current) drug therapy: Secondary | ICD-10-CM | POA: Diagnosis not present

## 2018-06-05 DIAGNOSIS — I10 Essential (primary) hypertension: Secondary | ICD-10-CM

## 2018-06-05 DIAGNOSIS — R55 Syncope and collapse: Secondary | ICD-10-CM | POA: Diagnosis not present

## 2018-06-05 DIAGNOSIS — Z79891 Long term (current) use of opiate analgesic: Secondary | ICD-10-CM | POA: Insufficient documentation

## 2018-06-05 DIAGNOSIS — I5032 Chronic diastolic (congestive) heart failure: Secondary | ICD-10-CM | POA: Diagnosis not present

## 2018-06-05 DIAGNOSIS — I110 Hypertensive heart disease with heart failure: Secondary | ICD-10-CM | POA: Insufficient documentation

## 2018-06-05 NOTE — Patient Instructions (Signed)
Continue weighing daily and call for an overnight weight gain of > 2 pounds or a weekly weight gain of >5 pounds. 

## 2018-06-06 ENCOUNTER — Encounter: Payer: Self-pay | Admitting: Family

## 2018-06-22 ENCOUNTER — Ambulatory Visit: Payer: Self-pay | Admitting: Physician Assistant

## 2018-07-06 ENCOUNTER — Ambulatory Visit: Payer: Self-pay | Admitting: Family

## 2018-07-10 ENCOUNTER — Ambulatory Visit: Payer: Self-pay | Admitting: Family

## 2018-07-13 NOTE — Progress Notes (Signed)
Patient ID: Helen Burgess, female    DOB: 04-24-1976, 42 y.o.   MRN: [id number]  HPI  Helen Burgess is a 42 y/o female with a history of HTN, panic attacks, migraines, atrial septal defect and chronic heart failure.   Echo report from 01/18/18 reviewed and showed an EF of 55-65%. Echo report from 03/30/15 reviewed and showed an EF of 55-65%.   Was in the ED 05/31/18 due to syncopal episodes. Head CT and CT angiogram were negative. IV lasix was given. She was discharged against medical advise. Was in the ED 01/05/18 due to pedal edema where she was treated with lasix and released. She was in the ED 10/22/17 due to atypical chest pain and panic attack where she was evaluated and released.   She presents today for her follow-up visit with a chief complaint of minimal shortness of breath upon moderate exertion. She describes this as chronic having been present for several months although has been improving quite a bit since her mental health is being treated better. She has associated fatigue, light-headedness, intermittent edema, depression and anxiety along with this. She denies any difficulty sleeping, abdominal distention, palpitations, chest pain, cough or weight gain. She has used sudafed a few times recently knowing that it can elevate BP/ HR.   Past Medical History:  Diagnosis Date  . ASD (atrial septal defect)   . Bowel obstruction (HCC)   . CHF (congestive heart failure) (HCC)   . Dermatillomania   . Hx SBO   . Migraines   . Occipital neuralgia   . Panic attacks    Past Surgical History:  Procedure Laterality Date  . ABDOMINAL SURGERY    . CHOLECYSTECTOMY    . 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 Diabetes Neg Hx    Social History   Tobacco Use  . Smoking status: Never Smoker  . Smokeless tobacco: Never Used  Substance Use Topics  . Alcohol use: No   Allergies  Allergen Reactions  .  Metoprolol Other (See Comments)    Syncope/ passing out  . Nsaids Other (See Comments)    Pancreatitis.  Marland Kitchen Ultram [Tramadol] Itching   Prior to Admission medications   Medication Sig Start Date End Date Taking? Authorizing Provider  AJOVY 225 MG/1.5ML SOSY Take 1 mL by mouth every 30 (thirty) days. 12/22/17  Yes [provider]  calcium carbonate (TUMS - DOSED IN MG ELEMENTAL CALCIUM) 500 MG chewable tablet Chew 1 tablet by mouth as needed for indigestion or heartburn.   Yes [provider]  cetirizine (ZYRTEC) 10 MG tablet Take 10 mg by mouth daily as needed for allergies (seasonal - spring/fall).   Yes [provider]  cyclobenzaprine (FLEXERIL) 10 MG tablet Take 1 tablet (10 mg total) by mouth at bedtime. Patient taking differently: Take 10 mg by mouth 3 (three) times daily.  05/02/15  Yes [name], MD  diazepam (VALIUM) 5 MG tablet Take 5 mg by mouth 2 (two) times daily.    Yes [provider]  escitalopram (LEXAPRO) 5 MG tablet Take 10 mg by mouth daily.    Yes [provider]  gabapentin (NEURONTIN) 300 MG capsule Take 300 mg by mouth 2 (two) times daily.   Yes [provider]  guanFACINE (TENEX) 1 MG tablet Take 1 mg by mouth at bedtime.   Yes [provider]  naloxone (NARCAN) nasal spray 4 mg/0.1 mL Place 1 spray into the nose as needed.   Yes [provider]  oxyCODONE ER (XTAMPZA ER) 27 MG C12A Take 18 tablets by mouth 2 (two) times daily.    Yes [provider]  oxyCODONE-acetaminophen (PERCOCET) 10-325 MG tablet Take 1 tablet by mouth every 6 (six) hours as needed for pain.  12/23/17  Yes [provider]  QUEtiapine (SEROQUEL) 50 MG tablet Take 50 mg by mouth at bedtime.   Yes [provider]  rizatriptan (MAXALT) 5 MG tablet Take 5 mg by mouth as needed for migraine. May repeat in 2 hours if needed   Yes [provider]  spironolactone (ALDACTONE) 25 MG tablet Take 1  tablet (25 mg total) by mouth daily. 04/26/18 07/25/18 Yes , Inetta Fermo A, FNP  torsemide (DEMADEX) 20 MG tablet Take 1 tablet (20 mg total) by mouth daily. Patient taking differently: Take 20 mg by mouth 2 (two) times daily.  05/11/18 08/09/18 Yes Delma Freeze, FNP    Review of Systems  Constitutional: Positive for fatigue. Negative for appetite change and fever.  HENT: Negative for congestion, postnasal drip and sore throat.   Eyes: Negative.   Respiratory: Positive for shortness of breath (with overexertion/ rushing around). Negative for cough, chest tightness and wheezing.   Cardiovascular: Positive for leg swelling (infrequent). Negative for chest pain and palpitations.  Gastrointestinal: Negative for abdominal distention, abdominal pain, nausea and vomiting.  Endocrine: Negative.   Genitourinary: Negative.   Musculoskeletal: Positive for back pain (chronic) and neck pain.  Skin: Negative.   Allergic/Immunologic: Negative.   Neurological: Positive for light-headedness (infrequent). Negative for dizziness, syncope, weakness and headaches.  Hematological: Negative for adenopathy. Does not bruise/bleed easily.  Psychiatric/Behavioral: Positive for dysphoric mood (improving). Negative for sleep disturbance and suicidal ideas. The patient is nervous/anxious (improving).    Vitals:   07/17/18 1516  BP: 134/87  Pulse: (!) 102  Resp: 18  SpO2: 100%  Weight: 168 lb (76.2 kg)  Height: 5\' 7"  (1.702 m)   Wt Readings from Last 3 Encounters:  07/17/18 168 lb (76.2 kg)  06/05/18 176 lb 8 oz (80.1 kg)  05/31/18 182 lb (82.6 kg)   Lab Results  Component Value Date   CREATININE 0.83 05/31/2018   CREATININE 0.81 05/11/2018   CREATININE 0.66 04/26/2018    Physical Exam  Constitutional: She is oriented to person, place, and time. She appears well-developed and well-nourished.  HENT:  Head: Normocephalic and atraumatic.  Neck: Normal range of motion. Neck supple. No JVD present.   Cardiovascular: Normal rate and regular rhythm.  Pulmonary/Chest: Effort normal. No respiratory distress. She has no wheezes. She has no rales.  Abdominal: Soft. She exhibits no distension. There is no tenderness.  Musculoskeletal: She exhibits no tenderness.       Right lower leg: She exhibits no tenderness and no edema.       Left lower leg: She exhibits no tenderness and no edema.  Neurological: She is alert and oriented to person, place, and time.  Skin: Skin is warm and dry.  Psychiatric: Her mood appears anxious. She does not exhibit a depressed mood. She expresses no suicidal ideation.  Nursing note and vitals reviewed.  Assessment & Plan:  1: Chronic heart failure with preserved ejection fraction- - NYHA class II - euvolemic today - weighing daily; Reinforced the importance of calling for an overnight weight gain of >2 pounds or a weekly weight gain of >5 pounds -  weight down ~ 9 pounds since last visit here 6 weeks ago; using an app called zipongo to get recipes/ track caloric intake - not adding salt to her food and has been reading food labels. Reviewed the importance of closely following a 2000mg  sodium diet - had ASD repaired at the age of 42 at [facility] - BNP 03/30/18 was 16.0 - tachycardic today but she says that she's taken sudafed recently  2: HTN-  - BP looks good - saw PCP [name] 11/30/16; emphasized that she really needed to focus on getting a new PCP - BMP 05/31/18 reviewed and showed sodium 138, potassium 3.3, creatinine 0.83 and GFR >60  3: Panic attacks/ PTSD- - reports doing well with psychiatrist and therapist - feels like medication she's been put on is now working - mom died when she was 89 years old, dad died 2 years ago (2017), trusted medical provider died 2 months after her dad, brother hung himself 6 (2013) years ago and her work responsibilities are extremely demanding  (has to process 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 Each medication was verbally reviewed with the patient and she was encouraged to bring the bottles to every visit to confirm accuracy of list.  Return in 3 months or sooner for any questions/problems before then. Letter given to her to be excused from jury duty.

## 2018-07-17 ENCOUNTER — Ambulatory Visit: Payer: 59 | Attending: Family | Admitting: Family

## 2018-07-17 ENCOUNTER — Encounter: Payer: Self-pay | Admitting: Family

## 2018-07-17 VITALS — BP 134/87 | HR 102 | Resp 18 | Ht 67.0 in | Wt 168.0 lb

## 2018-07-17 DIAGNOSIS — F410 Panic disorder [episodic paroxysmal anxiety] without agoraphobia: Secondary | ICD-10-CM | POA: Insufficient documentation

## 2018-07-17 DIAGNOSIS — Q211 Atrial septal defect: Secondary | ICD-10-CM | POA: Insufficient documentation

## 2018-07-17 DIAGNOSIS — I110 Hypertensive heart disease with heart failure: Secondary | ICD-10-CM | POA: Diagnosis present

## 2018-07-17 DIAGNOSIS — F329 Major depressive disorder, single episode, unspecified: Secondary | ICD-10-CM | POA: Diagnosis not present

## 2018-07-17 DIAGNOSIS — F4310 Post-traumatic stress disorder, unspecified: Secondary | ICD-10-CM | POA: Insufficient documentation

## 2018-07-17 DIAGNOSIS — Z888 Allergy status to other drugs, medicaments and biological substances status: Secondary | ICD-10-CM | POA: Insufficient documentation

## 2018-07-17 DIAGNOSIS — I10 Essential (primary) hypertension: Secondary | ICD-10-CM

## 2018-07-17 DIAGNOSIS — I5032 Chronic diastolic (congestive) heart failure: Secondary | ICD-10-CM | POA: Diagnosis present

## 2018-07-17 DIAGNOSIS — Z79899 Other long term (current) drug therapy: Secondary | ICD-10-CM | POA: Insufficient documentation

## 2018-07-17 DIAGNOSIS — Z886 Allergy status to analgesic agent status: Secondary | ICD-10-CM | POA: Diagnosis not present

## 2018-07-17 DIAGNOSIS — Z9049 Acquired absence of other specified parts of digestive tract: Secondary | ICD-10-CM | POA: Insufficient documentation

## 2018-07-17 DIAGNOSIS — Z885 Allergy status to narcotic agent status: Secondary | ICD-10-CM | POA: Insufficient documentation

## 2018-07-17 DIAGNOSIS — Z9889 Other specified postprocedural states: Secondary | ICD-10-CM | POA: Insufficient documentation

## 2018-07-17 NOTE — Patient Instructions (Signed)
Continue weighing daily and call for an overnight weight gain of > 2 pounds or a weekly weight gain of >5 pounds. 

## 2018-10-16 ENCOUNTER — Telehealth: Payer: Self-pay | Admitting: Family

## 2018-10-16 ENCOUNTER — Ambulatory Visit: Payer: Self-pay | Admitting: Family

## 2018-10-16 NOTE — Telephone Encounter (Signed)
[employer] case manager had called last week requesting more information regarding patient. Patient had an appointment today (10/16/2018) that she had to cancel saying she couldn't get off of work in time.   LM on case manager's voicemail asking her to call me back to specify what information she needed.  Called [phone] EXT [phone]

## 2018-10-19 NOTE — Progress Notes (Deleted)
Patient ID: Helen Burgess, female    DOB: [birth year], 43 y.o.   MRN: [id number]  HPI  [name] is a 42 y/o female with a history of HTN, panic attacks, migraines, atrial septal defect and chronic heart failure.   Echo report from 01/18/18 reviewed and showed an EF of 55-65%. Echo report from 03/30/15 reviewed and showed an EF of 55-65%.   Was in the ED 05/31/18 due to syncopal episodes. Head CT and CT angiogram were negative. IV lasix was given. She was discharged against medical advise. Was in the ED 01/05/18 due to pedal edema where she was treated with lasix and released. She was in the ED 10/22/17 due to atypical chest pain and panic attack where she was evaluated and released.   She presents today for her follow-up visit with a chief complaint of    Past Medical History:  Diagnosis Date  . ASD (atrial septal defect)   . Bowel obstruction (HCC)   . CHF (congestive heart failure) (HCC)   . Dermatillomania   . Hx SBO   . Migraines   . Occipital neuralgia   . Panic attacks    Past Surgical History:  Procedure Laterality Date  . ABDOMINAL SURGERY    . CHOLECYSTECTOMY    . 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 Diabetes Neg Hx    Social History   Tobacco Use  . Smoking status: Never Smoker  . Smokeless tobacco: Never Used  Substance Use Topics  . Alcohol use: No   Allergies  Allergen Reactions  . Metoprolol Other (See Comments)    Syncope/ passing out  . Nsaids Other (See Comments)    Pancreatitis.  Marland Kitchen Ultram [Tramadol] Itching     Review of Systems  Constitutional: Positive for fatigue. Negative for appetite change and fever.  HENT: Negative for congestion, postnasal drip and sore throat.   Eyes: Negative.   Respiratory: Positive for shortness of breath (with overexertion/ rushing around). Negative for cough, chest tightness and wheezing.   Cardiovascular: Positive for leg  swelling (infrequent). Negative for chest pain and palpitations.  Gastrointestinal: Negative for abdominal distention, abdominal pain, nausea and vomiting.  Endocrine: Negative.   Genitourinary: Negative.   Musculoskeletal: Positive for back pain (chronic) and neck pain.  Skin: Negative.   Allergic/Immunologic: Negative.   Neurological: Positive for light-headedness (infrequent). Negative for dizziness, syncope, weakness and headaches.  Hematological: Negative for adenopathy. Does not bruise/bleed easily.  Psychiatric/Behavioral: Positive for dysphoric mood (improving). Negative for sleep disturbance and suicidal ideas. The patient is nervous/anxious (improving).      Physical Exam Vitals signs and nursing note reviewed.  Constitutional:      Appearance: She is well-developed.  HENT:     Head: Normocephalic and atraumatic.  Neck:     Musculoskeletal: Normal range of motion and neck supple.     Vascular: No JVD.  Cardiovascular:     Rate and Rhythm: Normal rate and regular rhythm.  Pulmonary:     Effort: Pulmonary effort is normal. No respiratory distress.     Breath sounds: No wheezing or rales.  Abdominal:     General: There is no distension.     Palpations: Abdomen is soft.     Tenderness: There is no abdominal tenderness.  Musculoskeletal:        General: No tenderness.     Right lower leg:  She exhibits no tenderness. No edema.     Left lower leg: She exhibits no tenderness. No edema.  Skin:    General: Skin is warm and dry.  Neurological:     Mental Status: She is alert and oriented to person, place, and time.  Psychiatric:        Mood and Affect: Mood is anxious. Mood is not depressed.        Thought Content: Thought content does not include suicidal ideation.    Assessment & Plan:  1: Chronic heart failure with preserved ejection fraction- - NYHA class II - euvolemic today - weighing daily; Reinforced the importance of calling for an overnight weight gain of >2  pounds or a weekly weight gain of >5 pounds - weight down  - not adding salt to her food and has been reading food labels. Reviewed the importance of closely following a 2000mg  sodium diet - had ASD repaired at the age of 17 at [facility] - BNP 03/30/18 was 16.0 -  2: HTN-  - BP  - saw PCP Hyacinth Meeker) 11/30/16; emphasized that she really needed to focus on getting a new PCP - BMP 05/31/18 reviewed and showed sodium 138, potassium 3.3, creatinine 0.83 and GFR >60  3: Panic attacks/ PTSD- - reports doing well with psychiatrist and therapist - feels like medication she's been put on is now working - mom died when she was 38 years old, dad died in 12-17-2015, trusted medical provider died 2 months after her dad, brother hung himself in Dec 17, 2011 and her work responsibilities are extremely demanding  (has to process 65 insurance claims completely and 100% correct/ hour)   Patient did not bring her medications nor a list. Each medication was verbally reviewed with the patient and she was encouraged to bring the bottles to every visit to confirm accuracy of list.

## 2018-10-21 ENCOUNTER — Other Ambulatory Visit: Payer: Self-pay | Admitting: Family

## 2018-10-23 ENCOUNTER — Ambulatory Visit: Payer: Self-pay | Admitting: Family

## 2018-11-03 ENCOUNTER — Ambulatory Visit: Payer: Self-pay | Admitting: Family

## 2018-11-03 NOTE — Progress Notes (Deleted)
Patient ID: Helen Burgess, female    DOB: [birth year], 43 y.o.   MRN: [id number]  HPI  [name] is a 43 y/o female with a history of HTN, panic attacks, migraines, atrial septal defect and chronic heart failure.   Echo report from 01/18/18 reviewed and showed an EF of 55-65%. Echo report from 03/30/15 reviewed and showed an EF of 55-65%.   Was in the ED 05/31/18 due to syncopal episodes. Head CT and CT angiogram were negative. IV lasix was given. She was discharged against medical advise. Was in the ED 01/05/18 due to pedal edema where she was treated with lasix and released. She was in the ED 10/22/17 due to atypical chest pain and panic attack where she was evaluated and released.   She presents today for her follow-up visit with a chief complaint of    Past Medical History:  Diagnosis Date  . ASD (atrial septal defect)   . Bowel obstruction (HCC)   . CHF (congestive heart failure) (HCC)   . Dermatillomania   . Hx SBO   . Migraines   . Occipital neuralgia   . Panic attacks    Past Surgical History:  Procedure Laterality Date  . ABDOMINAL SURGERY    . CHOLECYSTECTOMY    . 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 Diabetes Neg Hx    Social History   Tobacco Use  . Smoking status: Never Smoker  . Smokeless tobacco: Never Used  Substance Use Topics  . Alcohol use: No   Allergies  Allergen Reactions  . Metoprolol Other (See Comments)    Syncope/ passing out  . Nsaids Other (See Comments)    Pancreatitis.  Marland Kitchen Ultram [Tramadol] Itching     Review of Systems  Constitutional: Positive for fatigue. Negative for appetite change and fever.  HENT: Negative for congestion, postnasal drip and sore throat.   Eyes: Negative.   Respiratory: Positive for shortness of breath (with overexertion/ rushing around). Negative for cough, chest tightness and wheezing.   Cardiovascular: Positive for leg  swelling (infrequent). Negative for chest pain and palpitations.  Gastrointestinal: Negative for abdominal distention, abdominal pain, nausea and vomiting.  Endocrine: Negative.   Genitourinary: Negative.   Musculoskeletal: Positive for back pain (chronic) and neck pain.  Skin: Negative.   Allergic/Immunologic: Negative.   Neurological: Positive for light-headedness (infrequent). Negative for dizziness, syncope, weakness and headaches.  Hematological: Negative for adenopathy. Does not bruise/bleed easily.  Psychiatric/Behavioral: Positive for dysphoric mood (improving). Negative for sleep disturbance and suicidal ideas. The patient is nervous/anxious (improving).      Physical Exam Vitals signs and nursing note reviewed.  Constitutional:      Appearance: She is well-developed.  HENT:     Head: Normocephalic and atraumatic.  Neck:     Musculoskeletal: Normal range of motion and neck supple.     Vascular: No JVD.  Cardiovascular:     Rate and Rhythm: Normal rate and regular rhythm.  Pulmonary:     Effort: Pulmonary effort is normal. No respiratory distress.     Breath sounds: No wheezing or rales.  Abdominal:     General: There is no distension.     Palpations: Abdomen is soft.     Tenderness: There is no abdominal tenderness.  Musculoskeletal:        General: No tenderness.     Right lower leg:  She exhibits no tenderness. No edema.     Left lower leg: She exhibits no tenderness. No edema.  Skin:    General: Skin is warm and dry.  Neurological:     Mental Status: She is alert and oriented to person, place, and time.  Psychiatric:        Mood and Affect: Mood is anxious. Mood is not depressed.        Thought Content: Thought content does not include suicidal ideation.    Assessment & Plan:  1: Chronic heart failure with preserved ejection fraction- - NYHA class II - euvolemic today - weighing daily; Reinforced the importance of calling for an overnight weight gain of >2  pounds or a weekly weight gain of >5 pounds - weight down  - not adding salt to her food and has been reading food labels. Reviewed the importance of closely following a 2000mg sodium diet - had ASD repaired at the age of 18 at [facility] - BNP 03/30/18 was 16.0 -  2: HTN-  - BP  - saw PCP (Miller) 11/30/16; emphasized that she really needed to focus on getting a new PCP - BMP 05/31/18 reviewed and showed sodium 138, potassium 3.3, creatinine 0.83 and GFR >60  3: Panic attacks/ PTSD- - reports doing well with psychiatrist and therapist - feels like medication she's been put on is now working - mom died when she was 11 years old, dad died in 2017, trusted medical provider died 2 months after her dad, brother hung himself in 2013 and her work responsibilities are extremely demanding  (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 Each medication was verbally reviewed with the patient and she was encouraged to bring the bottles to every visit to confirm accuracy of list.                      

## 2018-11-07 ENCOUNTER — Ambulatory Visit: Payer: Self-pay | Admitting: Family

## 2018-11-08 ENCOUNTER — Telehealth: Payer: Self-pay

## 2018-11-08 NOTE — Telephone Encounter (Signed)
   TELEPHONE CALL NOTE  This patient has been deemed a candidate for follow-up tele-health visit to limit community exposure during the Covid-19 pandemic. I spoke with the patient via phone to discuss instructions. The patient was advised to review the section on consent for treatment as well. The patient will receive a phone call 1-3 days prior to their E-Visit at which time consent will be verbally confirmed. A Virtual Office Visit appointment type has been scheduled for 11/09/2018 @ 1:40 pm with [name] FNP.  Vinnie Level, CMA 11/08/2018 2:19 PM

## 2018-11-09 ENCOUNTER — Encounter: Payer: Self-pay | Admitting: Family

## 2018-11-09 ENCOUNTER — Other Ambulatory Visit: Payer: Self-pay

## 2018-11-09 ENCOUNTER — Ambulatory Visit: Payer: 59 | Attending: Family | Admitting: Family

## 2018-11-09 VITALS — BP 134/67 | HR 90 | Wt 170.4 lb

## 2018-11-09 DIAGNOSIS — I5032 Chronic diastolic (congestive) heart failure: Secondary | ICD-10-CM

## 2018-11-09 DIAGNOSIS — I10 Essential (primary) hypertension: Secondary | ICD-10-CM

## 2018-11-09 NOTE — Patient Instructions (Signed)
Continue weighing daily and call for an overnight weight gain of > 2 pounds or a weekly weight gain of >5 pounds. 

## 2018-11-09 NOTE — Progress Notes (Signed)
Virtual Visit via Telephone Note     Evaluation Performed:  Follow-up visit  This visit type was conducted due to national recommendations for restrictions regarding the COVID-19 Pandemic (e.g. social distancing).  This format is felt to be most appropriate for this patient at this time.  All issues noted in this document were discussed and addressed.  No physical exam was performed (except for noted visual exam findings with Video Visits).  Please refer to the patient's chart (MyChart message for video visits and phone note for telephone visits) for the patient's consent to telehealth for [facility]  Date:  11/09/2018   ID:  Helen Shelterrystal W Burgess, DOB 01/12/1976, MRN [id number]  Patient Location[facility]  [address] [facility] [place] [id number]   [facility]   [facility] [address] [place], [place] [address]  PCP:  Danella PentonMiller, Mark F, MD  Cardiologist:  None Electrophysiologist:  None   Chief Complaint: follow-up visit  History of Present Illness:    Helen Burgess is a [age] y.o. female who presents via audio/video conferencing for a follow-up telehealth visit today.    The patient does not have symptoms concerning for COVID-19 infection (fever, chills, cough, or new SHORTNESS OF BREATH).   Patient has minimal shortness of breath upon moderate exertion. She describes this as chronic in nature having been present for several years. She has associated fatigue, head congestion, palpitations, minimal pedal edema and chronic back pain. She denies any weight change, difficulty sleeping, fever, sore throat, cough or chest pain.  Prior CV studies:   The following studies were reviewed today:  Echo 01/18/18 showed an EF of 55-65%.  Past Medical History:  Diagnosis Date  . ASD (atrial septal defect)   . Bowel obstruction (HCC)   . CHF (congestive heart failure) (HCC)   . Dermatillomania   . Hx SBO   . Migraines   . Occipital neuralgia   . Panic attacks    Past  Surgical History:  Procedure Laterality Date  . ABDOMINAL SURGERY    . CHOLECYSTECTOMY    . 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 Authorizing Provider  cetirizine (ZYRTEC) 10 MG tablet Take 10 mg by mouth daily as needed for allergies (seasonal - spring/fall).   Yes [provider]  cyclobenzaprine (FLEXERIL) 10 MG tablet Take 1 tablet (10 mg total) by mouth at bedtime. Patient taking differently: Take 10 mg by mouth 3 (three) times daily.  05/02/15  Yes Payton Mccallumonty, [place], MD  diazepam (VALIUM) 5 MG tablet Take 5 mg by mouth at bedtime as needed for anxiety.    Yes [provider]  escitalopram (LEXAPRO) 20 MG tablet Take 20 mg by mouth daily.   Yes [provider]  gabapentin (NEURONTIN) 300 MG capsule Take 300 mg by mouth 3 (three) times daily.    Yes [provider]  morphine (MS CONTIN) 30 MG 12 hr tablet Take 30 mg by mouth 3 (three) times daily.   Yes [provider]  oxyCODONE-acetaminophen (PERCOCET) 10-325 MG tablet Take 1 tablet by mouth every 6 (six) hours as needed for pain.  12/23/17  Yes [provider]  QUEtiapine (SEROQUEL) 50 MG tablet Take 50 mg by mouth 2 (two) times daily as needed.    Yes [provider]  spironolactone (ALDACTONE) 25 MG tablet  TAKE 1 TABLET BY MOUTH EVERY DAY 10/21/18  Yes [name] A, FNP  torsemide (DEMADEX) 20 MG tablet Take 20 mg by mouth daily.   Yes [provider]  AJOVY 225 MG/1.5ML SOSY Take 1 mL by mouth every 30 (thirty) days. 12/22/17   [provider]  calcium carbonate (TUMS - DOSED IN MG ELEMENTAL CALCIUM) 500 MG chewable tablet Chew 1 tablet by mouth as needed for indigestion or heartburn.    [provider]      Allergies:   Metoprolol; Nsaids; and Ultram [tramadol]   Social History   Tobacco Use  . Smoking status: Never Smoker   . Smokeless tobacco: Never Used  Substance Use Topics  . Alcohol use: No  . Drug use: No     Family Hx: The patient's family history is negative for Diabetes.  ROS:   Please see the history of present illness.     All other systems reviewed and are negative.   Labs/Other Tests and Data Reviewed:    Recent Labs: 01/05/2018: ALT 22 03/30/2018: B Natriuretic Peptide 16.0 05/31/2018: BUN 9; Creatinine, Ser 0.83; Hemoglobin 11.6; Magnesium 1.7; Platelets 323; Potassium 3.3; Sodium 138   Recent Lipid Panel Lab Results  Component Value Date/Time   CHOL 122 03/29/2015 04:00 AM   TRIG 211 (H) 03/29/2015 04:00 AM   HDL 21 (L) 03/29/2015 04:00 AM   CHOLHDL 5.8 03/29/2015 04:00 AM   LDLCALC 59 03/29/2015 04:00 AM    Wt Readings from Last 3 Encounters:  11/09/18 170 lb 6 oz (77.3 kg)  07/17/18 168 lb (76.2 kg)  06/05/18 176 lb 8 oz (80.1 kg)     Exam:    Vital Signs:  BP 134/67 Comment: self-reported  Pulse 90 Comment: self-reported  Wt 170 lb 6 oz (77.3 kg) Comment: self-reported  BMI 26.68 kg/m    Well nourished, well developed female in no  acute distress.   ASSESSMENT & PLAN:    1.  Chronic heart failure with preserved ejection fraction- - NYHA class II - euvolemic today - weighing daily and says that her weight has been stable. Reminded to call for an overnight weight gain of >2 pounds or a weekly weight gain of >5 pounds - not adding salt and is trying to follow a low sodium diet - continues to have episodes of tachycardia with heart rates ranging from 90-105; she does notice that when she drinks coffee, her HR is higher. She is going to start checking her HR twice daily - does have swelling in her legs that tends to occur when she's sitting for long periods of time during work and then when she elevates them in the bed at night, the swelling resolves  2: HTN- - self-reported BP today looks good - has not gotten established with a PCP as of yet; encouraged her to  do so - BMP from 05/31/18 reviewed and showed sodium 138, potassium 3.3, creatinine 0.83 and GFR >60  COVID-19 Education: The signs and symptoms of COVID-19 were discussed with the patient and how to seek care for testing (follow up with PCP or arrange E-visit).  The importance of social distancing was discussed today.  Patient Risk:   After full review of this patients clinical status, I feel that they are at least mild risk at this time.  Time:   Today, I have spent 40 minutes with the patient with telehealth technology discussing health status, medications, weight and symptoms to call about.   Medication Adjustments/Labs  and Tests Ordered: Current medicines are reviewed at length with the patient today.  Concerns regarding medicines are outlined above.  Tests Ordered: No orders of the defined types were placed in this encounter.  Medication Changes: No orders of the defined types were placed in this encounter.   Disposition:  Follow-up in 6 weeks or sooner for any questions/problems before then  Signed, Delma Freeze, FNP  11/09/2018 2:07 PM    [facility]

## 2018-11-16 ENCOUNTER — Telehealth: Payer: Self-pay | Admitting: Family

## 2018-11-16 NOTE — Telephone Encounter (Signed)
Returned phone call to Dr. Rayna Sexton. He says that he was calling regarding patient's disability claim and asked if I was still seeing her and when the last time I had seen her. Explained that we had done a virtual visit on 11/09/2018 and reviewed symptoms that patient was endorsing at that time.   He asked if I was only treating her medical problems and I said yes as she was seeing psychiatry for her mental health concerns.   He said that he currently had no further questions.

## 2018-11-17 ENCOUNTER — Telehealth: Payer: Self-pay | Admitting: Family

## 2018-11-17 NOTE — Telephone Encounter (Signed)
Returned message on our machine from someone requesting a peer to peer review regarding patient's disability claim. They were requesting November 20, 2018 at 2:00pm. My message left on their voicemail explained that I was going to be out of the office most of next week but could be available on November 21, 2018 at 2:00pm.   I also left on the message that I had just spoken to a Dr. Rayna Sexton yesterday regarding this.

## 2018-12-20 ENCOUNTER — Telehealth: Payer: Self-pay

## 2018-12-20 NOTE — Telephone Encounter (Signed)
   TELEPHONE CALL NOTE  This patient has been deemed a candidate for follow-up tele-health visit to limit community exposure during the Covid-19 pandemic. I spoke with the patient via phone to discuss instructions. The patient was advised to review the section on consent for treatment as well. The patient will receive a phone call 2-3 days prior to their E-Visit at which time consent will be verbally confirmed. A Virtual Office Visit appointment type has been scheduled for 12/21/2018 with [facility].  Vinnie Level, CMA 12/20/2018 12:24 PM

## 2018-12-20 NOTE — Telephone Encounter (Signed)
TELEPHONE CALL NOTE  Helen Burgess has been deemed a candidate for a follow-up tele-health visit to limit community exposure during the Covid-19 pandemic. I spoke with the patient via phone to ensure availability of phone/video source, confirm preferred email & phone number, discuss instructions and expectations, and review consent.   I reminded Helen Burgess to be prepared with any vital sign and/or heart rhythm information that could potentially be obtained via home monitoring, at the time of her visit.  Finally, I reminded Helen Burgess to expect an e-mail containing a link for their video-based visit approximately 15 minutes before her visit, or alternatively, a phone call at the time of her visit if her visit is planned to be a phone encounter.  Did the patient verbally consent to treatment as below? YES  Vinnie Level, CMA 12/20/2018 12:24 PM  CONSENT FOR TELE-HEALTH VISIT - PLEASE REVIEW  I hereby voluntarily request, consent and authorize [facility] and its employed or contracted physicians, physician assistants, nurse practitioners or other licensed health care professionals (the Practitioner), to provide me with telemedicine health care services (the "Services") as deemed necessary by the treating Practitioner. I acknowledge and consent to receive the Services by the Practitioner via telemedicine. I understand that the telemedicine visit will involve communicating with the Practitioner through telephonic communication technology and the disclosure of certain medical information by electronic transmission. I acknowledge that I have been given the opportunity to request an in-person assessment or other available alternative prior to the telemedicine visit and am voluntarily participating in the telemedicine visit.  I understand that I have the right to withhold or withdraw my consent to the use of telemedicine in the course of my care at any time, without affecting my  right to future care or treatment, and that the Practitioner or I may terminate the telemedicine visit at any time. I understand that I have the right to inspect all information obtained and/or recorded in the course of the telemedicine visit and may receive copies of available information for a reasonable fee.  I understand that some of the potential risks of receiving the Services via telemedicine include:  Marland Kitchen Delay or interruption in medical evaluation due to technological equipment failure or disruption; . Information transmitted may not be sufficient (e.g. poor resolution of images) to allow for appropriate medical decision making by the Practitioner; and/or  . In rare instances, security protocols could fail, causing a breach of personal health information.  Furthermore, I acknowledge that it is my responsibility to provide information about my medical history, conditions and care that is complete and accurate to the best of my ability. I acknowledge that Practitioner's advice, recommendations, and/or decision may be based on factors not within their control, such as incomplete or inaccurate data provided by me or lack of visual representation. I understand that the practice of medicine is not an exact science and that Practitioner makes no warranties or guarantees regarding treatment outcomes. I acknowledge that I will receive a copy of this consent concurrently upon execution via email to the email address I last provided but may also request a printed copy by calling the office of [facility].    I understand that my insurance may be billed for this visit.   I have read or had this consent read to me. . I understand the contents of this consent, which adequately explains the benefits and risks of the Services being provided via telemedicine.  Marland Kitchen  I have been provided ample opportunity to ask questions regarding this consent and the Services and have had my questions answered to my  satisfaction. . I give my informed consent for the services to be provided through the use of telemedicine in my medical care  By participating in this telemedicine visit I agree to the above.

## 2018-12-21 ENCOUNTER — Encounter: Payer: Self-pay | Admitting: Family

## 2018-12-21 ENCOUNTER — Other Ambulatory Visit: Payer: Self-pay

## 2018-12-21 ENCOUNTER — Ambulatory Visit: Payer: 59 | Attending: Family | Admitting: Family

## 2018-12-21 VITALS — Wt 171.5 lb

## 2018-12-21 DIAGNOSIS — I10 Essential (primary) hypertension: Secondary | ICD-10-CM

## 2018-12-21 DIAGNOSIS — I5032 Chronic diastolic (congestive) heart failure: Secondary | ICD-10-CM

## 2018-12-21 DIAGNOSIS — R55 Syncope and collapse: Secondary | ICD-10-CM

## 2018-12-21 NOTE — Patient Instructions (Signed)
Continue weighing daily and call for an overnight weight gain of > 2 pounds or a weekly weight gain of >5 pounds. 

## 2018-12-21 NOTE — Progress Notes (Signed)
Virtual Visit via Telephone Note    Evaluation Performed:  Follow-up visit  This visit type was conducted due to national recommendations for restrictions regarding the COVID-19 Pandemic (e.g. social distancing).  This format is felt to be most appropriate for this patient at this time.  All issues noted in this document were discussed and addressed.  No physical exam was performed (except for noted visual exam findings with Video Visits).  Please refer to the patient's chart (MyChart message for video visits and phone note for telephone visits) for the patient's consent to telehealth for [facility]  Date:  12/21/2018   ID:  Helen Burgess, DOB 09/27/1975, MRN [id number]  Patient Location:  [address] [facility] [place] [id number]   Provider location:   [facility] [address] [place], [place] [address]  PCP:  Danella PentonMiller, Mark F, MD  Cardiologist:  No primary care provider on file.  Electrophysiologist:  None   Chief Complaint:  Shortness of breath  History of Present Illness:    Helen Burgess is a 43 y.o. female who presents via audio/video conferencing for a telehealth visit today.  Patient verified DOB and address.  The patient does not have symptoms concerning for COVID-19 infection (fever, chills, cough, or new SHORTNESS OF BREATH).   Patient reports moderate shortness of breath upon minimal exertion. She describes this as chronic in nature having been present for several years. She does feel like it's worsened recently. She has associated pedal edema (improving), head congestion, fatigue, dizziness with continued episodes of syncope. She denies any chest pain, cough, fever or weight gain. Says that she had a syncopal episode 2 days ago and hit her head and was unable to work yesterday. She continues to see psychiatry and has an appointment later today with them. She has not gotten established with a PCP or [facility] cardiology yet. She has recently taken some  sudafed because of her head congestion with resultant HR of 101-109. Continues to report being under considerable stress due to work.   Prior CV studies:   The following studies were reviewed today:  Echo report from 01/18/18 reviewed and showed an EF of 55-65%.  Past Medical History:  Diagnosis Date  . ASD (atrial septal defect)   . Bowel obstruction (HCC)   . CHF (congestive heart failure) (HCC)   . Dermatillomania   . Hx SBO   . Migraines   . Occipital neuralgia   . Panic attacks    Past Surgical History:  Procedure Laterality Date  . ABDOMINAL SURGERY    . CHOLECYSTECTOMY    . 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 AJOVY 225 MG/1.5ML SOSY Take 1 mL by mouth every 30 (thirty) days.  . calcium carbonate (TUMS - DOSED IN MG ELEMENTAL CALCIUM) 500 MG chewable tablet Chew 1 tablet by mouth as needed for indigestion or heartburn.  . cetirizine (ZYRTEC) 10 MG tablet Take 10 mg by mouth daily as needed for allergies (seasonal - spring/fall).  . cyclobenzaprine (FLEXERIL) 10 MG tablet Take 1 tablet (10 mg total) by mouth at bedtime. (Patient taking differently: Take 10 mg by mouth 3 (three) times daily. )  . diazepam (VALIUM) 5 MG tablet Take 5 mg by mouth at bedtime as needed for anxiety.   Marland Kitchen. escitalopram (LEXAPRO) 20 MG tablet Take 20 mg by mouth daily.  .Marland Kitchen  gabapentin (NEURONTIN) 300 MG capsule Take 300 mg by mouth 3 (three) times daily.   Marland Kitchen morphine (MS CONTIN) 30 MG 12 hr tablet Take 30 mg by mouth 3 (three) times daily.  . naloxone (NARCAN) nasal spray 4 mg/0.1 mL Place 1 spray into the nose as needed.  Marland Kitchen oxyCODONE-acetaminophen (PERCOCET) 10-325 MG tablet Take 1 tablet by mouth every 8 (eight) hours as needed for pain.   Marland Kitchen QUEtiapine (SEROQUEL) 50 MG tablet Take 50 mg by mouth 2 (two) times daily as needed.   Marland Kitchen spironolactone (ALDACTONE) 25 MG tablet TAKE 1 TABLET BY MOUTH EVERY DAY  . torsemide (DEMADEX)  20 MG tablet Take 20 mg by mouth daily.     Allergies:   Metoprolol; Nsaids; and Ultram [tramadol]   Social History   Tobacco Use  . Smoking status: Never Smoker  . Smokeless tobacco: Never Used  Substance Use Topics  . Alcohol use: No  . Drug use: No     Family Hx: The patient's family history is negative for Diabetes.  ROS:   Please see the history of present illness.     All other systems reviewed and are negative.   Labs/Other Tests and Data Reviewed:    Recent Labs: 01/05/2018: ALT 22 03/30/2018: B Natriuretic Peptide 16.0 05/31/2018: BUN 9; Creatinine, Ser 0.83; Hemoglobin 11.6; Magnesium 1.7; Platelets 323; Potassium 3.3; Sodium 138   Recent Lipid Panel Lab Results  Component Value Date/Time   CHOL 122 03/29/2015 04:00 AM   TRIG 211 (H) 03/29/2015 04:00 AM   HDL 21 (L) 03/29/2015 04:00 AM   CHOLHDL 5.8 03/29/2015 04:00 AM   LDLCALC 59 03/29/2015 04:00 AM    Wt Readings from Last 3 Encounters:  12/21/18 171 lb 8 oz (77.8 kg)  11/09/18 170 lb 6 oz (77.3 kg)  07/17/18 168 lb (76.2 kg)     Exam:    Vital Signs:  Wt 171 lb 8 oz (77.8 kg) Comment: self-reported  BMI 26.86 kg/m    Well nourished, well developed female in no  acute distress.   ASSESSMENT & PLAN:    1. Chronic heart failure with preserved ejection fraction- - NYHA class III - euvolemic today based on patient's description of symptoms - weighing daily and says that her weight has been stable. Reminded to call for an overnight weight gain of >2 pounds or a weekly weight gain of >5 pounds - not adding salt and is trying to follow a low sodium diet - continues to have episodes of tachycardia with heart rates ranging from 101-109; she has recently taken some sudafed due to head congestion which could be contributing to her tachycardia - does have swelling in her legs that tends to occur when she's sitting for long periods of time during work and then when she elevates them in the bed at night,  the swelling resolves - has been wearing her compression socks with good relief of edema  2: HTN- - did not have BP reading available - has not gotten established with a PCP as of yet; encouraged her to do so - BMP from 05/31/18 reviewed and showed sodium 138, potassium 3.3, creatinine 0.83 and GFR >60   3: Syncope- - had recent syncopal episode 2 days ago  - had previously made appointment at [facility] cardiology that she cancelled. She says that she's going to call [facility] cardiology to make an appointment. Explained that I was concerned about arrhthymias with her syncopal episodes.  - she verbalized understanding but says  that she will call and make the appointment at [facility]   COVID-19 Education: The signs and symptoms of COVID-19 were discussed with the patient and how to seek care for testing (follow up with PCP or arrange E-visit).  The importance of social distancing was discussed today.  Patient Risk:   After full review of this patients clinical status, I feel that they are at least moderate risk at this time.  Time:   Today, I have spent 20 minutes with the patient with telehealth technology discussing symptoms, weight and medications.     Medication Adjustments/Labs and Tests Ordered: Current medicines are reviewed at length with the patient today.  Concerns regarding medicines are outlined above.   Tests Ordered: No orders of the defined types were placed in this encounter.  Medication Changes: No orders of the defined types were placed in this encounter.   Disposition:  Follow-up in 3 months or sooner for any questions/problems before then.   Signed, Delma Freeze, FNP  12/21/2018 1:50 PM    [facility]

## 2019-04-01 NOTE — Progress Notes (Deleted)
Patient ID: Helen Burgess, female    DOB: 1975/11/28, 43 y.o.   MRN: [id number]  HPI  Helen Burgess is a 43 y/o female with a history of HTN, panic attacks, migraines, atrial septal defect and chronic heart failure.   Echo report from 01/18/18 reviewed and showed an EF of 55-65%. Echo report from 03/30/15 reviewed and showed an EF of 55-65%.   Has not been admitted or been in the ED in the last 6 months.   Helen Burgess presents today for Helen Burgess follow-up visit with a chief complaint of   Past Medical History:  Diagnosis Date  . ASD (atrial septal defect)   . Bowel obstruction ([place])   . CHF (congestive heart failure) ([place])   . Dermatillomania   . Hx SBO   . Migraines   . Occipital neuralgia   . Panic attacks    Past Surgical History:  Procedure Laterality Date  . ABDOMINAL SURGERY    . CHOLECYSTECTOMY    . 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 Diabetes Neg Hx    Social History   Tobacco Use  . Smoking status: Never Smoker  . Smokeless tobacco: Never Used  Substance Use Topics  . Alcohol use: No   Allergies  Allergen Reactions  . Metoprolol Other (See Comments)    Syncope/ passing out  . Nsaids Other (See Comments)    Pancreatitis.  Marland Kitchen Ultram [Tramadol] Itching     Review of Systems  Constitutional: Positive for fatigue. Negative for appetite change and fever.  HENT: Negative for congestion, postnasal drip and sore throat.   Eyes: Negative.   Respiratory: Positive for shortness of breath (with overexertion/ rushing around). Negative for cough, chest tightness and wheezing.   Cardiovascular: Positive for leg swelling (infrequent). Negative for chest pain and palpitations.  Gastrointestinal: Negative for abdominal distention, abdominal pain, nausea and vomiting.  Endocrine: Negative.   Genitourinary: Negative.   Musculoskeletal: Positive for back pain (chronic) and neck pain.  Skin:  Negative.   Allergic/Immunologic: Negative.   Neurological: Positive for light-headedness (infrequent). Negative for dizziness, syncope, weakness and headaches.  Hematological: Negative for adenopathy. Does not bruise/bleed easily.  Psychiatric/Behavioral: Positive for dysphoric mood (improving). Negative for sleep disturbance and suicidal ideas. The patient is nervous/anxious (improving).      Physical Exam Vitals signs and nursing note reviewed.  Constitutional:      Appearance: Helen Burgess is well-developed.  HENT:     Head: Normocephalic and atraumatic.  Neck:     Musculoskeletal: Normal range of motion and neck supple.     Vascular: No JVD.  Cardiovascular:     Rate and Rhythm: Normal rate and regular rhythm.  Pulmonary:     Effort: Pulmonary effort is normal. No respiratory distress.     Breath sounds: No wheezing or rales.  Abdominal:     General: There is no distension.     Palpations: Abdomen is soft.     Tenderness: There is no abdominal tenderness.  Musculoskeletal:        General: No tenderness.     Right lower leg: Helen Burgess exhibits no tenderness. No edema.     Left lower leg: Helen Burgess exhibits no tenderness. No edema.  Skin:    General: Skin is warm and dry.  Neurological:     Mental Status: Helen Burgess is alert and oriented to person, place, and time.  Psychiatric:  Mood and Affect: Mood is anxious. Mood is not depressed.        Thought Content: Thought content does not include suicidal ideation.    Assessment & Plan:  1: Chronic heart failure with preserved ejection fraction- - NYHA class II - euvolemic today - weighing daily; Reinforced the importance of calling for an overnight weight gain of >2 pounds or a weekly weight gain of >5 pounds - weight 168 pounds since last visit here 8 months ago - not adding salt to Helen Burgess food and has been reading food labels. Reviewed the importance of closely following a 2000mg  sodium diet - had ASD repaired at the age of 55 at [facility] - BNP  03/30/18 was 16.0 - tachycardic today but Helen Burgess says that Helen Burgess's taken sudafed recently  2: HTN-  - BP  - saw PCP Sabra Heck) 11/30/16; emphasized that Helen Burgess really needed to focus on getting a new PCP - BMP 05/31/18 reviewed and showed sodium 138, potassium 3.3, creatinine 0.83 and GFR >60  3: Panic attacks/ PTSD- - reports doing well with psychiatrist and therapist - feels like medication Helen Burgess's been put on is now working - mom died when Helen Burgess was 57 years old, dad died 2 years ago 20-Dec-2015), trusted medical provider died 2 months after Helen Burgess dad, brother hung himself 27 12-20-11) years ago and Helen Burgess work responsibilities are extremely demanding  (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 Each medication was verbally reviewed with the patient and Helen Burgess was encouraged to bring the bottles to every visit to confirm accuracy of list.

## 2019-04-02 ENCOUNTER — Ambulatory Visit: Payer: 59 | Admitting: Family

## 2019-05-01 NOTE — Progress Notes (Deleted)
Patient ID: Helen Burgess, female    DOB: 1975/11/28, 43 y.o.   MRN: [id number]  HPI  Ms Hegna is a 43 y/o female with a history of HTN, panic attacks, migraines, atrial septal defect and chronic heart failure.   Echo report from 01/18/18 reviewed and showed an EF of 55-65%. Echo report from 03/30/15 reviewed and showed an EF of 55-65%.   Has not been admitted or been in the ED in the last 6 months.   She presents today for her follow-up visit with a chief complaint of   Past Medical History:  Diagnosis Date  . ASD (atrial septal defect)   . Bowel obstruction ([place])   . CHF (congestive heart failure) ([place])   . Dermatillomania   . Hx SBO   . Migraines   . Occipital neuralgia   . Panic attacks    Past Surgical History:  Procedure Laterality Date  . ABDOMINAL SURGERY    . CHOLECYSTECTOMY    . 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 Diabetes Neg Hx    Social History   Tobacco Use  . Smoking status: Never Smoker  . Smokeless tobacco: Never Used  Substance Use Topics  . Alcohol use: No   Allergies  Allergen Reactions  . Metoprolol Other (See Comments)    Syncope/ passing out  . Nsaids Other (See Comments)    Pancreatitis.  Marland Kitchen Ultram [Tramadol] Itching     Review of Systems  Constitutional: Positive for fatigue. Negative for appetite change and fever.  HENT: Negative for congestion, postnasal drip and sore throat.   Eyes: Negative.   Respiratory: Positive for shortness of breath (with overexertion/ rushing around). Negative for cough, chest tightness and wheezing.   Cardiovascular: Positive for leg swelling (infrequent). Negative for chest pain and palpitations.  Gastrointestinal: Negative for abdominal distention, abdominal pain, nausea and vomiting.  Endocrine: Negative.   Genitourinary: Negative.   Musculoskeletal: Positive for back pain (chronic) and neck pain.  Skin:  Negative.   Allergic/Immunologic: Negative.   Neurological: Positive for light-headedness (infrequent). Negative for dizziness, syncope, weakness and headaches.  Hematological: Negative for adenopathy. Does not bruise/bleed easily.  Psychiatric/Behavioral: Positive for dysphoric mood (improving). Negative for sleep disturbance and suicidal ideas. The patient is nervous/anxious (improving).      Physical Exam Vitals signs and nursing note reviewed.  Constitutional:      Appearance: She is well-developed.  HENT:     Head: Normocephalic and atraumatic.  Neck:     Musculoskeletal: Normal range of motion and neck supple.     Vascular: No JVD.  Cardiovascular:     Rate and Rhythm: Normal rate and regular rhythm.  Pulmonary:     Effort: Pulmonary effort is normal. No respiratory distress.     Breath sounds: No wheezing or rales.  Abdominal:     General: There is no distension.     Palpations: Abdomen is soft.     Tenderness: There is no abdominal tenderness.  Musculoskeletal:        General: No tenderness.     Right lower leg: She exhibits no tenderness. No edema.     Left lower leg: She exhibits no tenderness. No edema.  Skin:    General: Skin is warm and dry.  Neurological:     Mental Status: She is alert and oriented to person, place, and time.  Psychiatric:  Mood and Affect: Mood is anxious. Mood is not depressed.        Thought Content: Thought content does not include suicidal ideation.    Assessment & Plan:  1: Chronic heart failure with preserved ejection fraction- - NYHA class II - euvolemic today - weighing daily; Reinforced the importance of calling for an overnight weight gain of >2 pounds or a weekly weight gain of >5 pounds - weight 168 pounds since last visit here 9 months ago - not adding salt to her food and has been reading food labels. Reviewed the importance of closely following a 2000mg  sodium diet - had ASD repaired at the age of 39 at [facility] - BNP  03/30/18 was 16.0   2: HTN-  - BP  - saw PCP Sabra Heck) 11/30/16; emphasized that she really needed to focus on getting a new PCP - BMP 05/31/18 reviewed and showed sodium 138, potassium 3.3, creatinine 0.83 and GFR >60  3: Panic attacks/ PTSD- - reports doing well with psychiatrist and therapist - feels like medication she's been put on is now working - mom died when she was 35 years old, dad died 2 years ago Dec 25, 2015), trusted medical provider died 2 months after her dad, brother hung himself 2 12-25-11) years ago and her work responsibilities are extremely demanding  (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 Each medication was verbally reviewed with the patient and she was encouraged to bring the bottles to every visit to confirm accuracy of list.

## 2019-05-02 ENCOUNTER — Ambulatory Visit: Payer: 59 | Admitting: Family

## 2019-05-10 NOTE — Progress Notes (Deleted)
Patient ID: Helen Burgess, female    DOB: 1975/11/28, 43 y.o.   MRN: [id number]  HPI  Helen Burgess is a 43 y/o female with a history of HTN, panic attacks, migraines, atrial septal defect and chronic heart failure.   Echo report from 01/18/18 reviewed and showed an EF of 55-65%. Echo report from 03/30/15 reviewed and showed an EF of 55-65%.   Has not been admitted or been in the ED in the last 6 months.   She presents today for her follow-up visit with a chief complaint of   Past Medical History:  Diagnosis Date  . ASD (atrial septal defect)   . Bowel obstruction ([place])   . CHF (congestive heart failure) ([place])   . Dermatillomania   . Hx SBO   . Migraines   . Occipital neuralgia   . Panic attacks    Past Surgical History:  Procedure Laterality Date  . ABDOMINAL SURGERY    . CHOLECYSTECTOMY    . 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 Diabetes Neg Hx    Social History   Tobacco Use  . Smoking status: Never Smoker  . Smokeless tobacco: Never Used  Substance Use Topics  . Alcohol use: No   Allergies  Allergen Reactions  . Metoprolol Other (See Comments)    Syncope/ passing out  . Nsaids Other (See Comments)    Pancreatitis.  Marland Kitchen Ultram [Tramadol] Itching     Review of Systems  Constitutional: Positive for fatigue. Negative for appetite change and fever.  HENT: Negative for congestion, postnasal drip and sore throat.   Eyes: Negative.   Respiratory: Positive for shortness of breath (with overexertion/ rushing around). Negative for cough, chest tightness and wheezing.   Cardiovascular: Positive for leg swelling (infrequent). Negative for chest pain and palpitations.  Gastrointestinal: Negative for abdominal distention, abdominal pain, nausea and vomiting.  Endocrine: Negative.   Genitourinary: Negative.   Musculoskeletal: Positive for back pain (chronic) and neck pain.  Skin:  Negative.   Allergic/Immunologic: Negative.   Neurological: Positive for light-headedness (infrequent). Negative for dizziness, syncope, weakness and headaches.  Hematological: Negative for adenopathy. Does not bruise/bleed easily.  Psychiatric/Behavioral: Positive for dysphoric mood (improving). Negative for sleep disturbance and suicidal ideas. The patient is nervous/anxious (improving).      Physical Exam Vitals signs and nursing note reviewed.  Constitutional:      Appearance: She is well-developed.  HENT:     Head: Normocephalic and atraumatic.  Neck:     Musculoskeletal: Normal range of motion and neck supple.     Vascular: No JVD.  Cardiovascular:     Rate and Rhythm: Normal rate and regular rhythm.  Pulmonary:     Effort: Pulmonary effort is normal. No respiratory distress.     Breath sounds: No wheezing or rales.  Abdominal:     General: There is no distension.     Palpations: Abdomen is soft.     Tenderness: There is no abdominal tenderness.  Musculoskeletal:        General: No tenderness.     Right lower leg: She exhibits no tenderness. No edema.     Left lower leg: She exhibits no tenderness. No edema.  Skin:    General: Skin is warm and dry.  Neurological:     Mental Status: She is alert and oriented to person, place, and time.  Psychiatric:  Mood and Affect: Mood is anxious. Mood is not depressed.        Thought Content: Thought content does not include suicidal ideation.    Assessment & Plan:  1: Chronic heart failure with preserved ejection fraction- - NYHA class II - euvolemic today - weighing daily; Reinforced the importance of calling for an overnight weight gain of >2 pounds or a weekly weight gain of >5 pounds - weight 168 pounds since last visit here 9 months ago - not adding salt to her food and has been reading food labels. Reviewed the importance of closely following a 2000mg  sodium diet - had ASD repaired at the age of 39 at [facility] - BNP  03/30/18 was 16.0   2: HTN-  - BP  - saw PCP Helen Burgess) 11/30/16; emphasized that she really needed to focus on getting a new PCP - BMP 05/31/18 reviewed and showed sodium 138, potassium 3.3, creatinine 0.83 and GFR >60  3: Panic attacks/ PTSD- - reports doing well with psychiatrist and therapist - feels like medication she's been put on is now working - mom died when she was 35 years old, dad died 2 years ago Dec 25, 2015), trusted medical provider died 2 months after her dad, brother hung himself 2 12-25-11) years ago and her work responsibilities are extremely demanding  (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 Each medication was verbally reviewed with the patient and she was encouraged to bring the bottles to every visit to confirm accuracy of list.

## 2019-05-11 ENCOUNTER — Ambulatory Visit: Payer: 59 | Admitting: Family

## 2019-05-11 ENCOUNTER — Telehealth: Payer: Self-pay | Admitting: Family

## 2019-05-11 NOTE — Telephone Encounter (Signed)
Patient did not show for her [facility] appointment on 05/11/2019. Will attempt to reschedule.

## 2019-06-04 ENCOUNTER — Other Ambulatory Visit: Payer: Self-pay | Admitting: Family

## 2019-08-04 ENCOUNTER — Other Ambulatory Visit: Payer: Self-pay | Admitting: Family

## 2019-09-11 ENCOUNTER — Other Ambulatory Visit: Payer: Self-pay | Admitting: Family

## 2019-11-29 IMAGING — CR DG SHOULDER 2+V*L*
3 series · 5 of 5 positions shown · non-contrast
Comparison: Limited views of the left shoulder from a chest x-ray
April 01, 2015

CLINICAL DATA: Chronic neck pain for 6-7 years which in which has
increased over the past 2 and half months with radiation of symptoms
into the left upper extremity. History of left shoulder
decompression surgery and left first rib removal for thoracic outlet
syndrome.

EXAM:
LEFT SHOULDER - 2+ VIEW

[Series 1: shoulder grashey · 0.14mm/px · 2 of 2 slices shown]
[im 1/2]
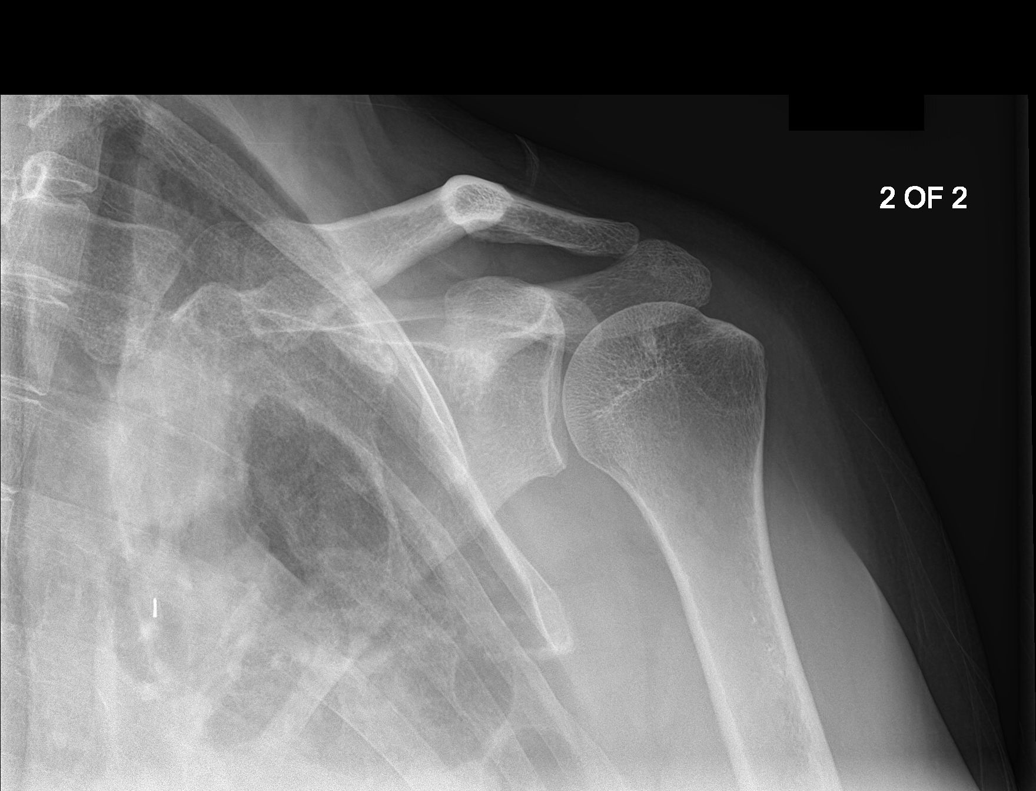
[im 2/2]
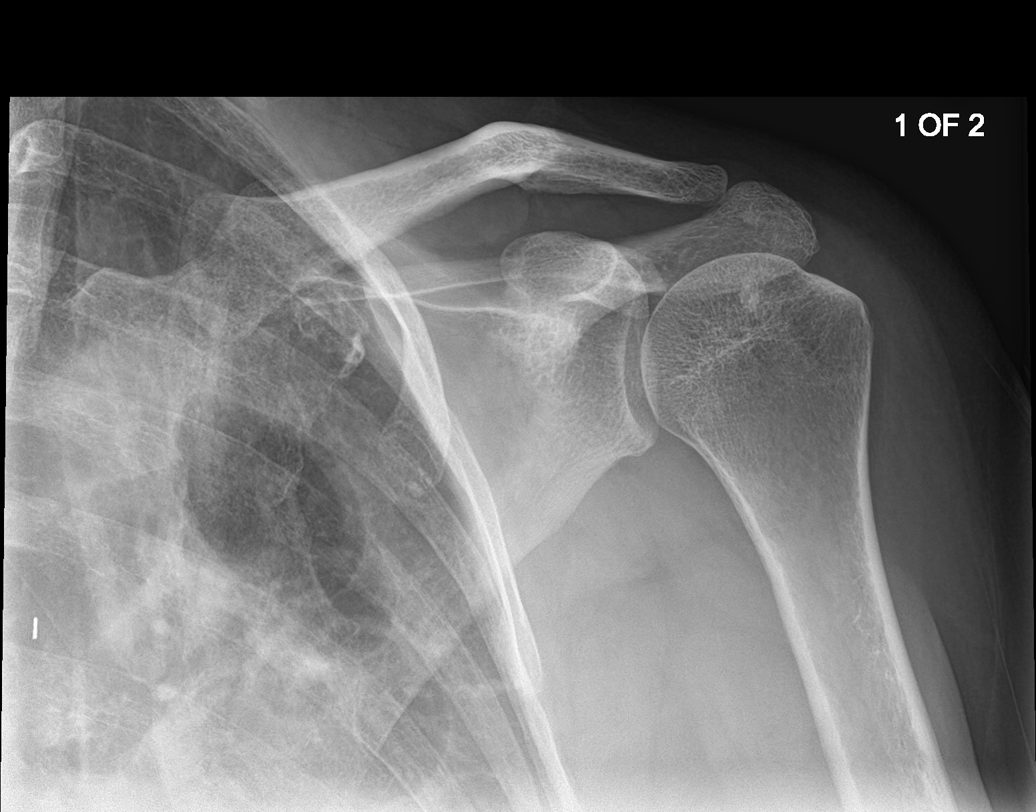

[Series 2: shoulder y view · 0.14mm/px · 2 of 2 slices shown]
[im 1/2]
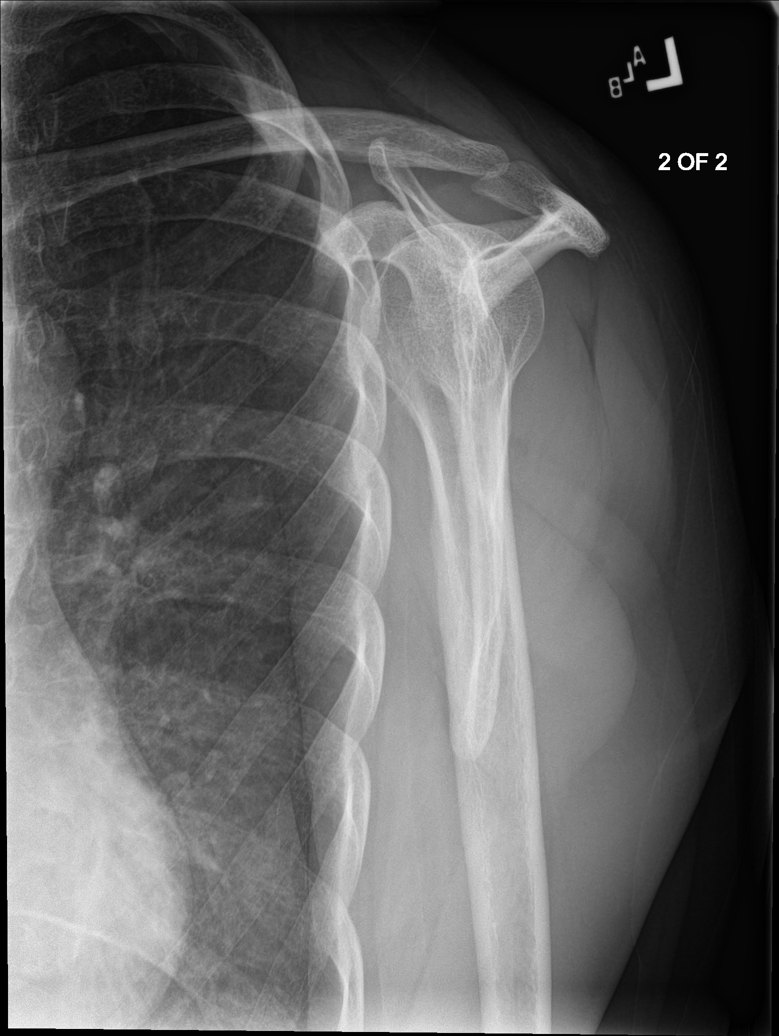
[im 2/2]
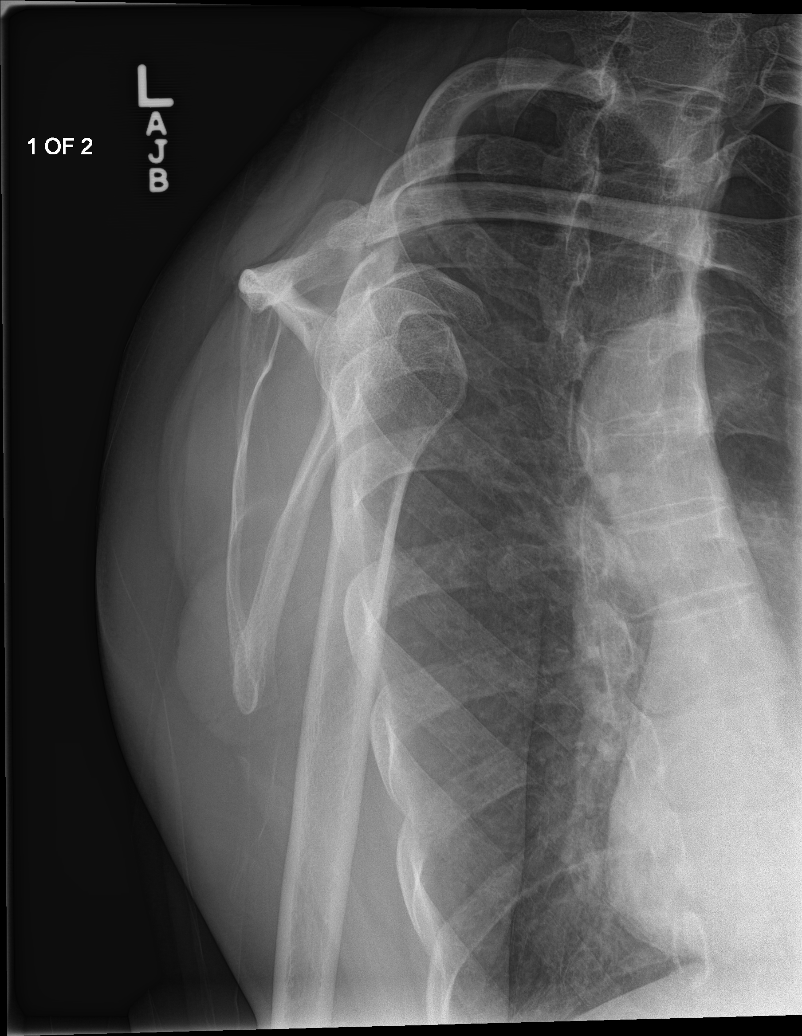

[shoulder axial]
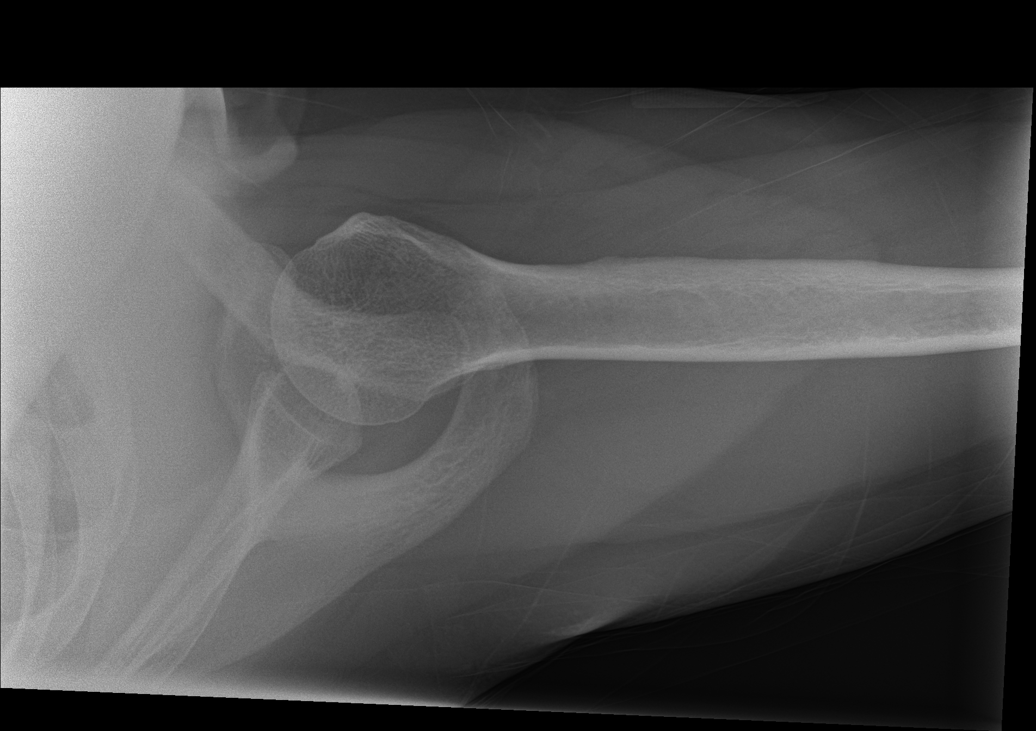

[5 of 5 positions shown; findings below may reference images not displayed]

FINDINGS: The bones are subjectively adequately mineralized. The joint spaces
are well maintained. There is mild narrowing of the subacromial
subdeltoid space which is not new. The soft tissues exhibit no
abnormal calcifications. There is no acute or old fracture.
IMPRESSION: Mild narrowing of the subacromial subdeltoid space which is been
previously demonstrated on MRI. There is no acute bony abnormality.

## 2020-01-10 ENCOUNTER — Ambulatory Visit: Payer: 59 | Admitting: Family

## 2020-01-10 NOTE — Progress Notes (Incomplete)
[facility] - HEART FAILURE CLINIC - PHARMACIST COUNSELING NOTE  ADHERENCE ASSESSMENT  Adherence strategy: ***   Do you ever forget to take your medication? [] Yes (1) [] No (0)  Do you ever skip doses due to side effects? [] Yes (1) [] No (0)  Do you have trouble affording your medicines? [] Yes (1) [] No (0)  Are you ever unable to pick up your medication due to transportation difficulties? [] Yes (1) [] No (0)  Do you ever stop taking your medications because you don't believe they are helping? [] Yes (1) [] No (0)  Total score _***______    Recommendations given to patient about increasing adherence: ***  Guideline-Directed Medical Therapy/Evidence Based Medicine  ACE/ARB/ARNI: None Beta Blocker: None Aldosterone Antagonist: Spironolactone 25 mg daily Diuretic: Torsemide 40 mg daily    SUBJECTIVE  HPI: Patient is a 44 y/o F with PMH as below who presents to CHF clinic for follow-up.   Past Medical History:  Diagnosis Date  . ASD (atrial septal defect)   . Bowel obstruction (HCC)   . CHF (congestive heart failure) (HCC)   . Dermatillomania   . Hx SBO   . Migraines   . Occipital neuralgia   . Panic attacks      OBJECTIVE   Vital signs: HR ***, BP ***, weight (pounds) *** ECHO: Date 01/18/18, EF 55-65%, notes: Normal LV cavity size, normal LV systolic function, normal LV diastolic parameters.  BMP Latest Ref Rng & Units 05/31/2018 05/11/2018 04/26/2018  Glucose 70 - 99 mg/dL ) ) )  BUN 6 - 20 mg/dL 9 9 10   Creatinine 0.44 - 1.00 mg/dL  45  Sodium 135 - 145 mmol/L 138 138 139  Potassium 3.5 - 5.1 mmol/L 3.3(L) 3.0(L) 2.8(L)  Chloride 98 - 111 mmol/L 97(L) 97(L) 96(L)  CO2 22 - 32 mmol/L 30 30 32  Calcium 8.9 - 10.3 mg/dL 03/20/18) 9.2 9.3    ASSESSMENT    PLAN  1). CHF  2). Hypertension  3). Lympedema  4). Depression/anxiety -Escitalopram 20 mg daily, diazepam 5 mg QHS PRN  5). Migraines -Ajovy   6). Chronic  pain -MS Contin 30 mg TID, Percocet 10-325 mg q8h PRN, gabapentin 300 mg TID -Narcan nasal spray on hand    Time spent: *** minutes  06/02/2018 Pharmacy Resident 01/10/2020 10:03 AM    Current Outpatient Medications:  .  AJOVY 225 MG/1.5ML SOSY, Take 1 mL by mouth every 30 (thirty) days., Disp: , Rfl: 3 .  calcium carbonate (TUMS - DOSED IN MG ELEMENTAL CALCIUM) 500 MG chewable tablet, Chew 1 tablet by mouth as needed for indigestion or heartburn., Disp: , Rfl:  .  cetirizine (ZYRTEC) 10 MG tablet, Take 10 mg by mouth daily as needed for allergies (seasonal - spring/fall)., Disp: , Rfl:  .  cyclobenzaprine (FLEXERIL) 10 MG tablet, Take 1 tablet (10 mg total) by mouth at bedtime. (Patient taking differently: Take 10 mg by mouth 3 (three) times daily. ), Disp: 15 tablet, Rfl: 0 .  diazepam (VALIUM) 5 MG tablet, Take 5 mg by mouth at bedtime as needed for anxiety. , Disp: , Rfl:  .  escitalopram (LEXAPRO) 20 MG tablet, Take 20 mg by mouth daily., Disp: , Rfl:  .  gabapentin (NEURONTIN) 300 MG capsule, Take 300 mg by mouth 3 (three) times daily. , Disp: , Rfl:  .  morphine (MS CONTIN) 30 MG 12 hr tablet, Take 30 mg by mouth 3 (three) times daily., Disp: , Rfl:  .  naloxone (NARCAN) nasal spray 4 mg/0.1 mL, Place 1 spray into the nose as needed., Disp: , Rfl:  .  oxyCODONE-acetaminophen (PERCOCET) 10-325 MG tablet, Take 1 tablet by mouth every 8 (eight) hours as needed for pain. , Disp: , Rfl: 0 .  QUEtiapine (SEROQUEL) 50 MG tablet, Take 50 mg by mouth 2 (two) times daily as needed. , Disp: , Rfl:  .  spironolactone (ALDACTONE) 25 MG tablet, Take 1 tablet (25 mg total) by mouth daily. MUST HAVE APPOINTMENT FOR FURTHER REFILLS, Disp: 30 tablet, Rfl: 0 .  torsemide (DEMADEX) 20 MG tablet, Take 2 tablets (40 mg total) by mouth daily. Needs appointment for further refills., Disp: 60 tablet, Rfl: 1   COUNSELING POINTS/CLINICAL PEARLS (*** DELETE ANY DRUGS NOT ON PATIENTS MEDICATION  LIST***) Carvedilol (Goal: weight less than 85 kg is 25 mg BID, weight greater than 85 kg is 50 mg BID)  Patient should avoid activities requiring coordination until drug effects are realized, as drug may cause dizziness.  This drug may cause diarrhea, nausea, vomiting, arthralgia, back pain, myalgia, headache, vision disorder, erectile dysfunction, reduced libido, or fatigue.  Instruct patient to report signs/symptoms of adverse cardiovascular effects such as hypotension (especially in elderly patients), arrhythmias, syncope, palpitations, angina, or edema.  Drug may mask symptoms of hypoglycemia. Advise diabetic patients to carefully monitor blood sugar levels.  Patient should take drug with food.  Advise patient against sudden discontinuation of drug. Bisoprolol (Goal: 10 mg once daily) Patient should avoid activities requiring mental alertness or coordination until drug effects are realized.  This drug may cause bradyarrhythmia, cold extremities, hypotension, dyspepsia, dizziness, headache, dyssomnia, upper respiratory infection, or fatigue.  Drug may mask symptoms of hypoglycemia. Advise diabetic patients to carefully monitor blood sugar levels.  Advise patient against sudden discontinuation of drug.  Instruct patient to take a missed dose as soon as possible, but if next dose is in less than 8 h, skip the missed dose. Metoprolol Succinate (Goal: 200 mg once daily) Warn patient to avoid activities requiring mental alertness or coordination until drug effects are realized, as drug may cause dizziness. Tell patient planning major surgery with anesthesia to alert physician that drug is being used, as drug impairs ability of heart to respond to reflex adrenergic stimuli. Drug may cause diarrhea, fatigue, headache, or depression. Advise diabetic patient to carefully monitor blood glucose as drug may mask symptoms of hypoglycemia. Patient should take extended-release tablet with or immediately  following meals. Counsel patient against sudden discontinuation of drug, as this may precipitate hypertension, angina, or myocardial infarction. In the event of a missed dose, counsel patient to skip the missed dose and maintain a regular dosing schedule. Lisinopril (Goal: 20 to 40 mg once daily)  This drug may cause nausea, vomiting, dizziness, headache, or angioedema of face, lips, throat, or intestines.  Instruct patient to report signs/symptoms of hypotension, or a persistent cough.  Advise patient against sudden discontinuation of drug. Enalapril (Goal: 10 to 20 mg mg twice daily)  Patient should avoid activities requiring mental alertness or coordination until drug effects are realized.  Instruct patient to rise slowly from a sitting/supine position, as drug may cause orthostatic hypotension.  This drug may cause nausea, vomiting, diarrhea, fatigue, rash, dizziness, headache, or asthenia.  Instruct patient to report signs/symptoms of hypotension (lightheadedness or syncope) or persistent cough.  Tell patient to report symptoms of angioedema (swelling of face, extremities, eyes, lips, or tongue, or difficulty in swallowing or breathing) or intestinal angioedema (abdominal pain).  Instruct patient to report symptoms of hepatic failure (jaundice) or acute renal failure.  Advise patient to maintain adequate hydration during therapy to prevent volume depletion and an excessive fall in blood pressure.  Instruct patient to immediately report signs/symptoms of infection (sore throat or fever).  Instruct patients/caregivers on the preparation method for the oral solution or suspension and inform them of the expiration date following reconstitution.  Patient should avoid use of potassium-sparing diuretics or potassium-containing supplements or salt substitutes without first consulting their healthcare provider, as the drug may cause increased potassium levels. Losartan (Goal: 150 mg once daily)  Warn  female patient to avoid pregnancy and to report a pregnancy that occurs during therapy.  Side effects may include dizziness, upper respiratory infection, nasal congestion, and back pain.  Warn patient to avoid use of potassium supplements or potassium-containing salt substitutes unless they consult healthcare provider. Valsartan (Goal: 160 mg twice daily)  Advise patient to report lightheadedness or syncope.  Tell patient to avoid activities requiring coordination until drug effects are realized, as this medicine may cause dizziness.  Side effects may include abdominal pain, diarrhea, hypotension, headache, cough, or fatigue.  Advise patient to avoid potassium supplements and foods/salt substitutes that are high in potassium. Entresto (Goal: 97/103 mg twice daily)  Warn female patient to avoid pregnancy during therapy and to report a pregnancy to a physician.  Advise patient to report symptomatic hypotension.  Side effects may include hyperkalemia, cough, dizziness, or renal failure. Furosemide  Drug causes sun-sensitivity. Advise patient to use sunscreen and avoid tanning beds. Patient should avoid activities requiring coordination until drug effects are realized, as drug may cause dizziness, vertigo, or blurred vision. This drug may cause hyperglycemia, hyperuricemia, constipation, diarrhea, loss of appetite, nausea, vomiting, purpuric disorder, cramps, spasticity, asthenia, headache, paresthesia, or scaling eczema. Instruct patient to report unusual bleeding/bruising or signs/symptoms of hypotension, infection, pancreatitis, or ototoxicity (tinnitus, hearing impairment). Advise patient to report signs/symptoms of a severe skin reactions (flu-like symptoms, spreading red rash, or skin/mucous membrane blistering) or erythema multiforme. Instruct patient to eat high-potassium foods during drug therapy, as directed by healthcare professional.  Patient should not drink alcohol while taking this  drug. Torsemide  Side effects may include excessive urination.  Tell patient to report symptoms of ototoxicity.  Instruct patient to report lightheadedness or syncope.  Warn patient to avoid use of nonprescription NSAID products without first discussing it with their healthcare provider. Spironolactone  Warn patient to report dehydration, hypotension, or symptoms of worsening renal function.  Counsel female patient to report gynecomastia.  Side effects may include diarrhea, nausea, vomiting, abdominal cramping, fever, leg cramps, lethargy, mental confusion, decreased libido, irregular menses, and rash. Suspension: Tell patient to take drug consistently with respect to food, either before or after a meal.  Advise patient to avoid potassium supplements and foods containing high levels of potassium, including salt substitutes. Eplerenone  Patient should avoid activities requiring coordination until drug effects are realized, as drug may cause dizziness.  This drug may cause diarrhea, headache, cough, fatigue, influenza-like illness, angina, or myocardial infarction.  Patient should avoid potassium supplements, potassium-containing salt substitutes and other potassium-sparing diuretics during therapy.  DRUGS TO AVOID IN HEART FAILURE  Drug or Class Mechanism  Analgesics . NSAIDs . COX-2 inhibitors . Glucocorticoids  Sodium and water retention, increased systemic vascular resistance, decreased response to diuretics   Diabetes Medications . Metformin . Thiazolidinediones o Rosiglitazone (Avandia) o Pioglitazone (Actos) . DPP4 Inhibitors o Saxagliptin (Onglyza) o Sitagliptin (Januvia)  Lactic acidosis Possible calcium channel blockade   Unknown  Antiarrhythmics . Class I  o Flecainide o Disopyramide . Class III o Sotalol . Other o Dronedarone  Negative inotrope, proarrhythmic   Proarrhythmic, beta blockade  Negative inotrope  Antihypertensives . Alpha  Blockers o Doxazosin . Calcium Channel Blockers o Diltiazem o Verapamil o Nifedipine . Central Alpha Adrenergics o Moxonidine . Peripheral Vasodilators o Minoxidil  Increases renin and aldosterone  Negative inotrope    Possible sympathetic withdrawal  Unknown  Anti-infective . Itraconazole . Amphotericin B  Negative inotrope Unknown  Hematologic . Anagrelide . Cilostazol   Possible inhibition of PD IV Inhibition of PD III causing arrhythmias  Neurologic/Psychiatric . Stimulants . Anti-Seizure Drugs o Carbamazepine o Pregabalin . Antidepressants o Tricyclics o Citalopram . Parkinsons o Bromocriptine o Pergolide o Pramipexole . Antipsychotics o Clozapine . Antimigraine o Ergotamine o Methysergide . Appetite suppressants . Bipolar o Lithium  Peripheral alpha and beta agonist activity  Negative inotrope and chronotrope Calcium channel blockade  Negative inotrope, proarrhythmic Dose-dependent QT prolongation  Excessive serotonin activity/valvular damage Excessive serotonin activity/valvular damage Unknown  IgE mediated hypersensitivy, calcium channel blockade  Excessive serotonin activity/valvular damage Excessive serotonin activity/valvular damage Valvular damage  Direct myofibrillar degeneration, adrenergic stimulation  Antimalarials . Chloroquine . Hydroxychloroquine Intracellular inhibition of lysosomal enzymes  Urologic Agents . Alpha Blockers o Doxazosin o Prazosin o Tamsulosin o Terazosin  Increased renin and aldosterone  Adapted from Page RL, et al. "Drugs That May Cause or Exacerbate Heart Failure: A Scientific Statement from the [employer]." Circulation 2016; 818:E99-B71. DOI: 10.1161/CIR.0000000000000426   MEDICATION ADHERENCES TIPS AND STRATEGIES 1. Taking medication as prescribed improves patient outcomes in heart failure (reduces hospitalizations, improves symptoms, increases survival) 2. Side effects of  medications can be managed by decreasing doses, switching agents, stopping drugs, or adding additional therapy. Please let someone in the [facility] know if you have having bothersome side effects so we can modify your regimen. Do not alter your medication regimen without talking to [place].  3. Medication reminders can help patients remember to take drugs on time. If you are missing or forgetting doses you can try linking behaviors, using pill boxes, or an electronic reminder like an alarm on your phone or an app. Some people can also get automated phone calls as medication reminders.

## 2020-01-10 NOTE — Progress Notes (Deleted)
Patient ID: Helen Burgess, female    DOB: Jul 16, 1976, 44 y.o.   MRN: [id number]  HPI  [name] is a 44 y/o female with a history of HTN, panic attacks, migraines, atrial septal defect and chronic heart failure.   Echo report from 01/18/18 reviewed and showed an EF of 55-65%. Echo report from 03/30/15 reviewed and showed an EF of 55-65%.   Has not been admitted or been in the ED in the last 6 months.   She presents today for her follow-up visit although hasn't been seen since December 2019. She presents with a chief complaint of  Past Medical History:  Diagnosis Date  . ASD (atrial septal defect)   . Bowel obstruction ([place])   . CHF (congestive heart failure) ([place])   . Dermatillomania   . Hx SBO   . Migraines   . Occipital neuralgia   . Panic attacks    Past Surgical History:  Procedure Laterality Date  . ABDOMINAL SURGERY    . CHOLECYSTECTOMY    . 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 Diabetes Neg Hx    Social History   Tobacco Use  . Smoking status: Never Smoker  . Smokeless tobacco: Never Used  Substance Use Topics  . Alcohol use: No   Allergies  Allergen Reactions  . Metoprolol Other (See Comments)    Syncope/ passing out  . Nsaids Other (See Comments)    Pancreatitis.  Marland Kitchen Ultram [Tramadol] Itching     Review of Systems  Constitutional: Positive for fatigue. Negative for appetite change and fever.  HENT: Negative for congestion, postnasal drip and sore throat.   Eyes: Negative.   Respiratory: Positive for shortness of breath (with overexertion/ rushing around). Negative for cough, chest tightness and wheezing.   Cardiovascular: Positive for leg swelling (infrequent). Negative for chest pain and palpitations.  Gastrointestinal: Negative for abdominal distention, abdominal pain, nausea and vomiting.  Endocrine: Negative.   Genitourinary: Negative.   Musculoskeletal:  Positive for back pain (chronic) and neck pain.  Skin: Negative.   Allergic/Immunologic: Negative.   Neurological: Positive for light-headedness (infrequent). Negative for dizziness, syncope, weakness and headaches.  Hematological: Negative for adenopathy. Does not bruise/bleed easily.  Psychiatric/Behavioral: Positive for dysphoric mood (improving). Negative for sleep disturbance and suicidal ideas. The patient is nervous/anxious (improving).      Physical Exam Vitals and nursing note reviewed.  Constitutional:      Appearance: She is well-developed.  HENT:     Head: Normocephalic and atraumatic.  Neck:     Vascular: No JVD.  Cardiovascular:     Rate and Rhythm: Normal rate and regular rhythm.  Pulmonary:     Effort: Pulmonary effort is normal. No respiratory distress.     Breath sounds: No wheezing or rales.  Abdominal:     General: There is no distension.     Palpations: Abdomen is soft.     Tenderness: There is no abdominal tenderness.  Musculoskeletal:        General: No tenderness.     Cervical back: Normal range of motion and neck supple.     Right lower leg: No tenderness. No edema.     Left lower leg: No tenderness. No edema.  Skin:    General: Skin is warm and dry.  Neurological:     Mental Status: She is alert and oriented to person, place,  and time.  Psychiatric:        Mood and Affect: Mood is anxious. Mood is not depressed.        Thought Content: Thought content does not include suicidal ideation.    Assessment & Plan:  1: Chronic heart failure with preserved ejection fraction- - NYHA class II - euvolemic today - weighing daily; Reinforced the importance of calling for an overnight weight gain of >2 pounds or a weekly weight gain of >5 pounds - weight  - not adding salt to her food and has been reading food labels. Reviewed the importance of closely following a 2000mg  sodium diet - had ASD repaired at the age of 53 at [facility] - BNP 03/30/18 was 16.0   2:  HTN-  - BP  - saw PCP 04/01/18) 11/30/16; emphasized that she really needed to focus on getting a new PCP - BMP 05/31/18 reviewed and showed sodium 138, potassium 3.3, creatinine 0.83 and GFR >60  3: Panic attacks/ PTSD- - reports doing well with psychiatrist and therapist - feels like medication she's been put on is now working - mom died when she was 58 years old, dad died 2 years ago December 12, 2015), trusted medical provider died 2 months after her dad, brother hung himself 6 12/12/11) years ago and her work responsibilities are extremely demanding  (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 Each medication was verbally reviewed with the patient and she was encouraged to bring the bottles to every visit to confirm accuracy of list.

## 2020-01-11 ENCOUNTER — Ambulatory Visit: Payer: 59 | Admitting: Family

## 2020-01-11 ENCOUNTER — Telehealth: Payer: Self-pay | Admitting: Family

## 2020-01-11 NOTE — Telephone Encounter (Signed)
Patient had an appointment yesterday that she called to R/S as she was going to be late. She called today right before her 10am appt saying she was 5 minutes away and NT explained that she needed to be here by 10:10am or we would need to R/S. At 10:14am, PAT called to ask for grace as patient showed up at the wrong entrance and they were walking her around to [facility].   When patient arrived in our office at 10:21am, explained that I would be glad to see her but it would be at 11:30am which was at my next available appointment. Patient got angry and left saying that it wasn't her fault she was late and that she would be back. She then went back to PAT who called up here asking what was going on. NT explained the above to staff member and, again, said we would see at 11:30am.  Patient called back to say that she would not be returning for her appointment today and that her pain doctor "who also does primary care" will handle everything from now on as she felt like we didn't want her as a patient. She was upset saying that she didn't understand why I couldn't see her as soon as she got there. I explained that I couldn't make already scheduled patients wait because I was working her in late and that if the situation was reversed, I also wouldn't make her wait if someone showed up late.   I did tell patient that I appreciated her calling to say she wouldn't be returning so we wouldn't wonder if she was coming. I again, said that I would be glad to see her at 11:30am but she declined.

## 2020-05-04 IMAGING — CR DG ABDOMEN ACUTE W/ 1V CHEST
4 series · 4 of 4 positions shown · non-contrast
Comparison: CT abdomen and pelvis April 02, 2015; chest radiograph
October 22, 2017

CLINICAL DATA: Abdominal pain

EXAM:
DG ABDOMEN ACUTE W/ 1V CHEST

[chest pa]
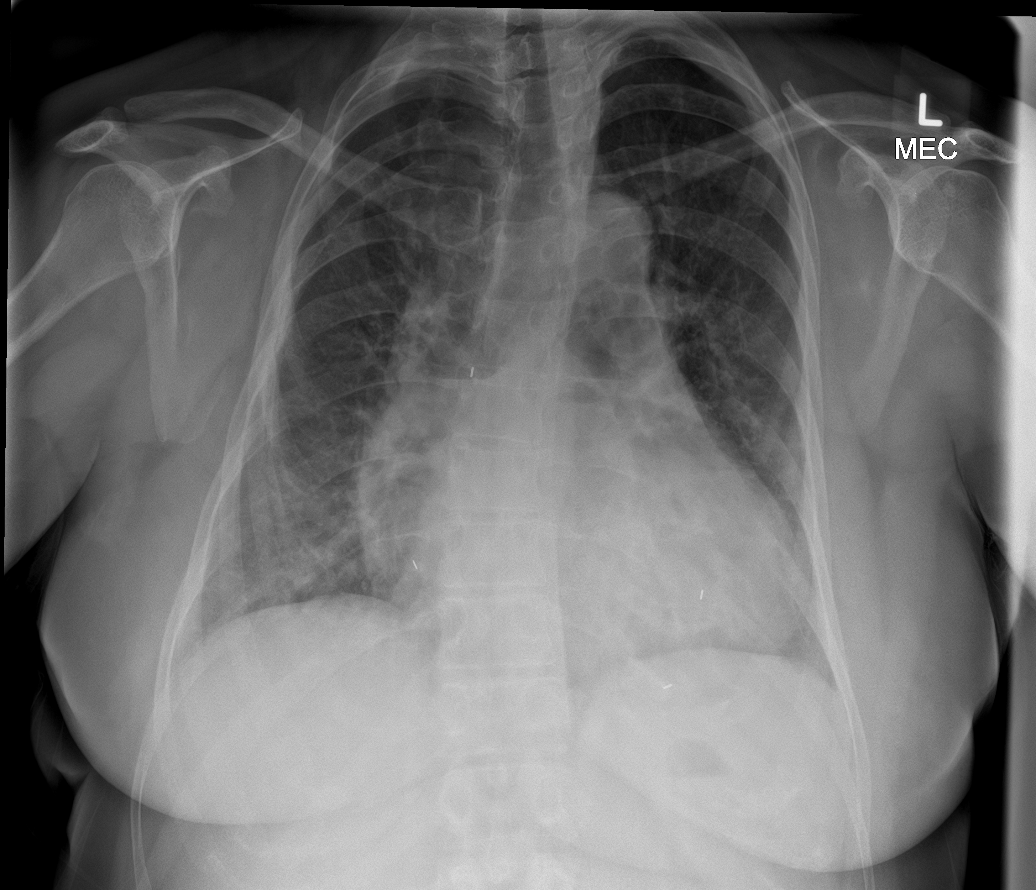

[abdomen erect]
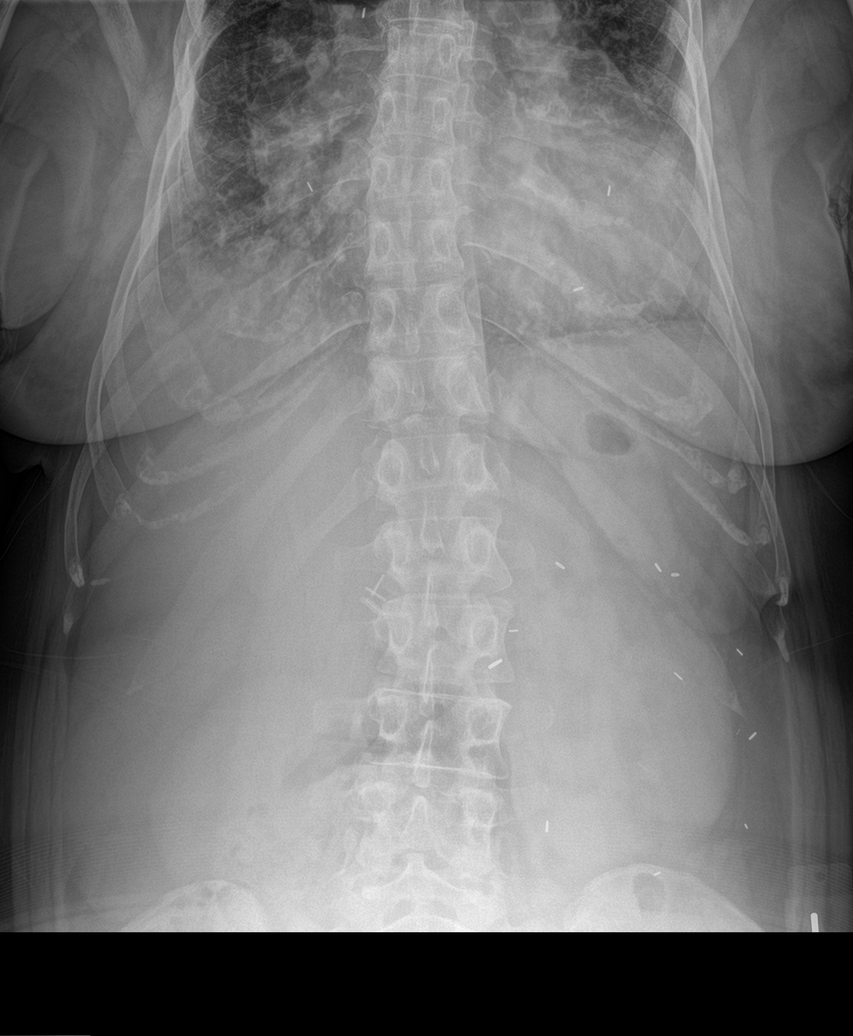

[abdomen supine (1 of 2)]
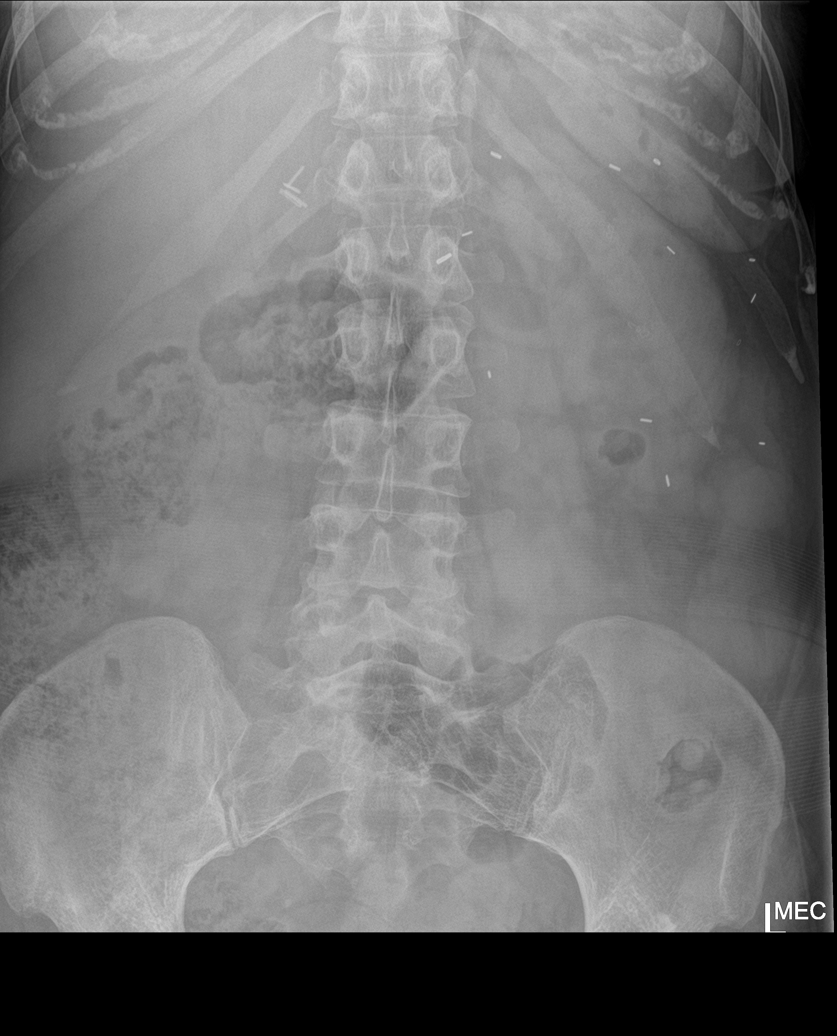

[abdomen supine (2 of 2)]
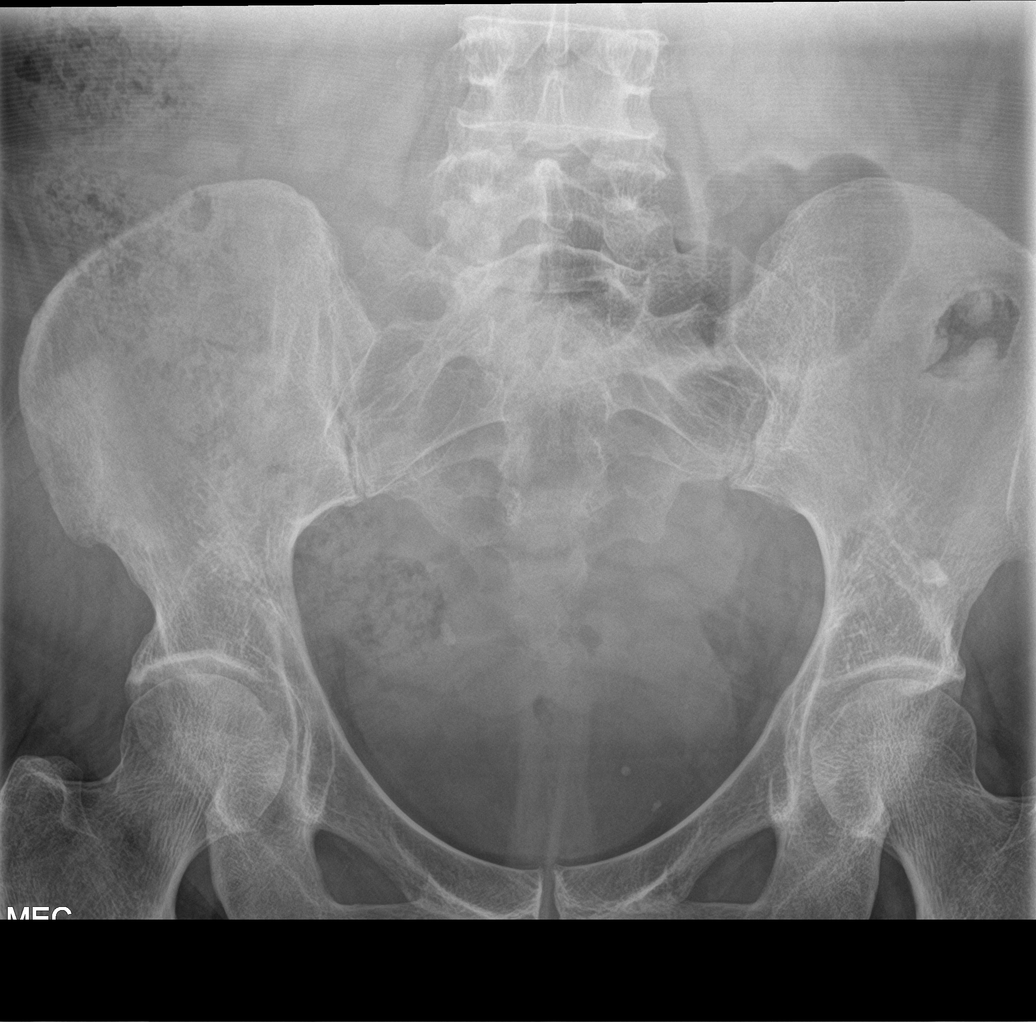

[4 of 4 positions shown; findings below may reference images not displayed]

FINDINGS: PA chest: There is cardiomegaly with pulmonary venous hypertension.
There is mild scarring in the bases. There is no frank edema or
consolidation. No evident adenopathy. Scattered clips are noted in
the thoracic region, stable. Air is noted in the esophagus.

Supine and upright abdomen: There is stool throughout much of the
colon. There is no bowel dilatation or air-fluid level to suggest
bowel obstruction. No free air. There are multiple surgical clips in
the pelvis.
IMPRESSION: No bowel obstruction or free air evident.  Moderate stool in colon.

Air noted in the esophagus. Patient has had previous bowel
interposition within the chest.

Stable cardiomegaly with mild pulmonary vascular congestion. Mild
scarring in the lung bases. No frank edema or consolidation.

## 2021-01-15 ENCOUNTER — Ambulatory Visit: Payer: 59 | Admitting: Dermatology

## 2022-04-09 ENCOUNTER — Encounter: Payer: Self-pay | Admitting: Emergency Medicine

## 2022-04-09 ENCOUNTER — Emergency Department
Admission: EM | Admit: 2022-04-09 | Discharge: 2022-04-09 | Disposition: A | Discharge status: Home / Self Care | Payer: Self-pay | Attending: Emergency Medicine | Admitting: Emergency Medicine

## 2022-04-09 ENCOUNTER — Emergency Department: Payer: Self-pay

## 2022-04-09 ENCOUNTER — Other Ambulatory Visit: Payer: Self-pay

## 2022-04-09 ENCOUNTER — Telehealth: Payer: Self-pay | Admitting: Cardiovascular Disease

## 2022-04-09 DIAGNOSIS — I110 Hypertensive heart disease with heart failure: Secondary | ICD-10-CM | POA: Insufficient documentation

## 2022-04-09 DIAGNOSIS — I4892 Unspecified atrial flutter: Secondary | ICD-10-CM

## 2022-04-09 DIAGNOSIS — R0789 Other chest pain: Secondary | ICD-10-CM | POA: Insufficient documentation

## 2022-04-09 DIAGNOSIS — K219 Gastro-esophageal reflux disease without esophagitis: Secondary | ICD-10-CM

## 2022-04-09 DIAGNOSIS — I5030 Unspecified diastolic (congestive) heart failure: Secondary | ICD-10-CM | POA: Insufficient documentation

## 2022-04-09 LAB — COMPREHENSIVE METABOLIC PANEL
ALT: 11 U/L (ref 0–44)
AST: 15 U/L (ref 15–41)
Albumin: 4 g/dL (ref 3.5–5.0)
Alkaline Phosphatase: 65 U/L (ref 38–126)
Anion gap: 7 (ref 5–15)
BUN: 11 mg/dL (ref 6–20)
CO2: 25 mmol/L (ref 22–32)
Calcium: 9.6 mg/dL (ref 8.9–10.3)
Chloride: 108 mmol/L (ref 98–111)
Creatinine, Ser: 0.53 mg/dL (ref 0.44–1.00)
GFR, Estimated: >60 mL/min (ref >60)
Glucose, Bld: 114 mg/dL — ABNORMAL HIGH (ref 70–99)
Potassium: 3.8 mmol/L (ref 3.5–5.1)
Sodium: 140 mmol/L (ref 135–145)
Total Bilirubin: 1 mg/dL (ref 0.3–1.2)
Total Protein: 7.1 g/dL (ref 6.5–8.1)

## 2022-04-09 LAB — TROPONIN I (HIGH SENSITIVITY): Troponin I (High Sensitivity): 5 ng/L (ref <18)

## 2022-04-09 LAB — TSH: TSH: 1.48 u[IU]/mL (ref 0.350–4.500)

## 2022-04-09 LAB — MAGNESIUM: Magnesium: 1.8 mg/dL (ref 1.7–2.4)

## 2022-04-09 LAB — CBC
HCT: 41.5 % (ref 36.0–46.0)
Hemoglobin: 14.1 g/dL (ref 12.0–15.0)
MCH: 29.1 pg (ref 26.0–34.0)
MCHC: 34 g/dL (ref 30.0–36.0)
MCV: 85.7 fL (ref 80.0–100.0)
Platelets: 351 10*3/uL (ref 150–400)
RBC: 4.84 MIL/uL (ref 3.87–5.11)
RDW: 13 % (ref 11.5–15.5)
WBC: 10.2 10*3/uL (ref 4.0–10.5)
nRBC: 0 % (ref 0.0–0.2)

## 2022-04-09 LAB — D-DIMER, QUANTITATIVE: D-Dimer, Quant: 0.28 ug/mL-FEU (ref 0.00–0.50)

## 2022-04-09 LAB — LIPASE, BLOOD: Lipase: 36 U/L (ref 11–51)

## 2022-04-09 MED ORDER — LIDOCAINE VISCOUS HCL 2 % MT SOLN
15.0000 mL | Freq: Once | OROMUCOSAL | Status: AC
  Administered 2022-04-09: 15 mL via OROMUCOSAL
  Filled 2022-04-09: qty 15

## 2022-04-09 MED ORDER — SODIUM CHLORIDE 0.9 % IV BOLUS
1000.0000 mL | Freq: Once | INTRAVENOUS | Status: AC
  Administered 2022-04-09: 1000 mL via INTRAVENOUS

## 2022-04-09 MED ORDER — DILTIAZEM HCL ER COATED BEADS 180 MG PO CP24: 180.0000 mg | ORAL_CAPSULE | Freq: Every day | ORAL | 0 refills | Status: DC

## 2022-04-09 MED ORDER — LIDOCAINE VISCOUS HCL 2 % MT SOLN: 10.0000 mL | OROMUCOSAL | 0 refills | Status: AC | PRN

## 2022-04-09 MED ORDER — ALUMINUM-MAGNESIUM-SIMETHICONE 200-200-20 MG/5ML PO SUSP: 30.0000 mL | Freq: Three times a day (TID) | ORAL | 0 refills | Status: DC

## 2022-04-09 NOTE — Telephone Encounter (Signed)
-----   Message from Gibson Ramp, RN sent at 04/09/2022  2:38 PM EDT -----  ----- Message ----- From: Antonieta Iba, MD Sent: 04/09/2022   2:26 PM EDT To: Mickie [facility] Triage  Can we arrange new patient appointment Patient received phone call when she was in emergency room April 09, 2022 for tachycardia, flutter Any provider, not established Thx TGollan

## 2022-04-09 NOTE — ED Triage Notes (Signed)
Pt to ED via POV for chest pain since Thursday morning. Pt states that she got food stuck in her throat Thursday morning and it would not go down for about 30 minutes and that is when her pain started. Pt states that her chest hurt all day yesterday but once she stopped eating or drinking the pain stopped. Pt states that once she started trying to eat and drink again, the pain came back. Pt is currently in NAD.

## 2022-04-09 NOTE — ED Provider Notes (Signed)
[facility] Provider Note    Event Date/Time   First MD Initiated Contact with Patient 04/09/22 [phone]     (approximate)   History   Chief Complaint: Chest Pain   HPI  Helen Burgess is a 46 y.o. female with a history of hypertension, anxiety, diastolic heart failure who comes ED complaining of chest pain for the past 24 hours, constant, worse with eating.  No alleviating factors.  She has been avoiding eating and drinking during this time because of the pain.  Denies vomiting.  She is able to swallow her oral secretions.  She does note a history of esophageal atresia requiring colonic interposition which was done decades ago.  Also had ASD repair as a child.     Physical Exam   Triage Vital Signs: ED Triage Vitals  Enc Vitals Group     BP 04/09/22 0836 (!) 163/107     Pulse Rate 04/09/22 0836 (!) 135     Resp 04/09/22 0836 16     Temp 04/09/22 0836 98.6 F (37 C)     Temp Source 04/09/22 0836 Oral     SpO2 04/09/22 0836 96 %     Weight 04/09/22 0833 145 lb (65.8 kg)     Height 04/09/22 0833 5\' 8"  (1.727 m)     Head Circumference --      Peak Flow --      Pain Score 04/09/22 0833 5     Pain Loc --      Pain Edu? --      Excl. in GC? --     Most recent vital signs: Vitals:   04/09/22 1200 04/09/22 1230  BP: (!) 137/98 (!) 142/91  Pulse: (!) 106 (!) 113  Resp: (!) 22 (!) 22  Temp:    SpO2: 100% 95%    General: Awake, no distress.  CV:  Good peripheral perfusion.  Tachycardia, heart rate 130.  Normal distal pulses. Resp:  Normal effort.  Clear to auscultation bilaterally. Abd:  No distention.  Soft and nontender Other:  No subcu emphysema.  Thyroid nonpalpable, trachea midline.   ED Results / Procedures / Treatments   Labs (all labs ordered are listed, but only abnormal results are displayed) Labs Reviewed  COMPREHENSIVE METABOLIC PANEL - Abnormal; Notable for the following components:      Result Value   Glucose, Bld 114 (*)     All other components within normal limits  CBC  D-DIMER, QUANTITATIVE  LIPASE, BLOOD  TSH  MAGNESIUM  POC URINE PREG, ED  TROPONIN I (HIGH SENSITIVITY)  TROPONIN I (HIGH SENSITIVITY)     EKG Interpreted by me Atrial flutter, rate of 142.  Normal axis and intervals.  Poor R wave progression.  Normal ST segments and T waves.  No ischemic changes.   RADIOLOGY Chest x-ray interpreted by me, appears normal.  Radiology report reviewed   PROCEDURES:  Procedures   MEDICATIONS ORDERED IN ED: Medications  sodium chloride 0.9 % bolus 1,000 mL (1,000 mLs Intravenous New Bag/Given 04/09/22 0959)  lidocaine (XYLOCAINE) 2 % viscous mouth solution 15 mL (15 mLs Mouth/Throat Given 04/09/22 0959)     IMPRESSION / MDM / ASSESSMENT AND PLAN / ED COURSE  I reviewed the triage vital signs and the nursing notes.                              Differential diagnosis includes, but is not  limited to, GERD, esophageal obstruction, pulmonary embolism, hyperthyroidism, non-STEMI, pancreatitis, dehydration  Patient's presentation is most consistent with acute presentation with potential threat to life or bodily function.  Patient presents with nonspecific chest pain, nonischemic in character.  Patient was given viscous lidocaine which greatly alleviated her symptoms and she was able to tolerate oral intake.  She is incidentally found to be in atrial flutter.  With IV fluids, her heart rate has improved to about 110.  Discussed with cardiology who can follow-up in office.  We will continue her on extended release diltiazem in the meantime.  Serum work-up is entirely normal.   Considering the patient's symptoms, medical history, and physical examination today, I have low suspicion for ACS, PE, TAD, pneumothorax, carditis, mediastinitis, pneumonia, CHF, or sepsis.  She does not require admission and can be discharged home.       FINAL CLINICAL IMPRESSION(S) / ED DIAGNOSES   Final diagnoses:   None     Rx / DC Orders   ED Discharge Orders     None        Note:  This document was prepared using Dragon voice recognition software and may include unintentional dictation errors.   Sharman Cheek, MD 04/09/22 [phone]

## 2022-04-09 NOTE — Telephone Encounter (Signed)
Lvm to schedule ed follow up appointment. Patient will be a new patient

## 2022-04-12 ENCOUNTER — Telehealth: Payer: Self-pay | Admitting: Cardiovascular Disease

## 2022-04-12 NOTE — Telephone Encounter (Signed)
Discussed with manager in this situation, ok to use DOD slot.  Spoke with pt and scheduled new pt appt with Dr. Mariah Milling 04/26/22 at 11 am.  Pt confirmed she will arrive at 10:45 am for 11 am appt.  Pt voiced understanding of location.  Pt appreciative and has no further questions.

## 2022-04-12 NOTE — Telephone Encounter (Signed)
LVM to schedule ED new patient appt. 

## 2022-04-12 NOTE — Telephone Encounter (Signed)
Patient is returning call to schedule NP appointment. She states her insurance does not go into effect until 09/05 so she would need the appointment after this date. The first available after 09/05 for a NP is not until 10/12, but she will run out of medication prior to this date. Please advise.

## 2022-04-25 NOTE — Progress Notes (Unsigned)
Cardiology Office Note  Date:  04/26/2022   ID:  Helen Burgess, DOB 04-24-1976, MRN [id number]  PCP:  Pcp, No   Chief Complaint  Patient presents with   New Patient (Initial Visit)    Follow up [facility]; tachycardia. Medications reviewed by the patient verbally. Patient c/o rapid heart beats, occasional chest pain and fluttering at times.     HPI:  Helen Burgess is a 46 yo woman with PMH of HTN Anxiety Paroxysmal tachycardia ASD repair, 1995 at [facility] (hole to big to close with catheter) Who presents by referral from the ER for tachycardia  Food stuck in her throat, pasta stuck, hurt to swallow for >24 hours In the ER 04/09/2022,  chest pain for the past 24 hours, constant, worse with eating.  No alleviating factors.  She has been avoiding eating and drinking during this time because of the pain.  Denies vomiting.  She is able to swallow her oral secretions.   She does note a history of esophageal atresia requiring colonic interposition which was done decades ago.   ASD repair as a child.  given viscous lidocaine which greatly alleviated her symptoms and she was able to tolerate oral intake Tachycardia, heart rate 130 In "atrial flutter",  started on diltiazem ER 180 daily Having headaches daily, stopped drinking coffee, taking tylenol  Reports having long hx of elevated heart rate Watch alarming for elevated heart rate  Works at computer, gets neck discomfort  EKG personally reviewed by myself on todays visit NSr rate 82 bpm no ST or T wave changes  PMH:   has a past medical history of ASD (atrial septal defect), Bowel obstruction (HCC), CHF (congestive heart failure) (HCC), Dermatillomania, SBO, Migraines, Occipital neuralgia, and Panic attacks.  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     cyclobenzaprine (FLEXERIL) 10 MG tablet Take 1 tablet (10 mg total) by mouth at bedtime. (Patient taking differently: Take 10 mg by mouth 3 (three) times daily.) 15 tablet 0   AJOVY 225 MG/1.5ML SOSY Take 1 mL by mouth every 30 (thirty) days. (Patient not taking: Reported on 04/26/2022)  3   aluminum-magnesium hydroxide-simethicone (MAALOX) 200-200-20 MG/5ML SUSP Take 30 mLs by mouth 4 (four) times daily -  before meals and at bedtime. (Patient not taking: Reported on 04/26/2022) 355 mL 0   buprenorphine-naloxone (SUBOXONE) 8-2 mg SUBL SL tablet Place 1.5 tablets under the tongue daily. (Patient not taking: Reported on 04/26/2022)     diazepam (VALIUM) 5 MG tablet Take 5 mg by mouth at bedtime as needed for anxiety.  (Patient not taking: Reported on 04/26/2022)     diltiazem (CARDIZEM CD) 180 MG 24 hr capsule Take 1 capsule (180 mg total) by mouth daily. 30 capsule 6   escitalopram (LEXAPRO) 20 MG tablet Take 20 mg by mouth daily. (Patient not taking: Reported on 04/26/2022)     gabapentin (NEURONTIN) 300 MG capsule Take 300 mg by mouth 3 (three) times daily.  (Patient not taking: Reported on 04/26/2022)     lidocaine (XYLOCAINE) 2 % solution Use as directed 10 mLs in the mouth or throat every 4 (four)  hours as needed (throat pain). Swallow as needed (Patient not taking: Reported on 04/26/2022) 100 mL 0   morphine (Helen CONTIN) 30 MG 12 hr tablet Take 30 mg by mouth 3 (three) times daily. (Patient not taking: Reported on 04/26/2022)     naloxone [facility]) nasal spray 4 mg/0.1 mL Place 1 spray into the nose as needed. (Patient not taking: Reported on 04/26/2022)     oxyCODONE-acetaminophen (PERCOCET) 10-325 MG tablet Take 1 tablet by mouth every 8 (eight) hours as needed for pain.  (Patient not taking: Reported on 04/26/2022)  0   QUEtiapine (SEROQUEL) 50 MG tablet Take 50 mg by mouth 2  (two) times daily as needed.  (Patient not taking: Reported on 04/26/2022)     No current facility-administered medications for this visit.     Allergies:   Metoprolol, Ibuprofen, Nsaids, and Ultram [tramadol]   Social History:  The patient  reports that she has never smoked. She has never used smokeless tobacco. She reports that she does not drink alcohol and does not use drugs.   Family History:   family history is not on file.    Review of Systems: Review of Systems  Constitutional: Negative.   HENT: Negative.    Respiratory: Negative.    Cardiovascular: Negative.   Gastrointestinal: Negative.   Musculoskeletal: Negative.   Neurological: Negative.   Psychiatric/Behavioral: Negative.    All other systems reviewed and are negative.    PHYSICAL EXAM: VS:  BP 120/80 (BP Location: Right Arm, Patient Position: Sitting, Cuff Size: Normal)   Pulse 82   Ht 5\' 8"  (1.727 m)   Wt 152 lb 6 oz (69.1 kg)   SpO2 98%   BMI 23.17 kg/m  , BMI Body mass index is 23.17 kg/m. GEN: Well nourished, well developed, in no acute distress HEENT: normal Neck: no JVD, carotid bruits, or masses Cardiac: RRR; no murmurs, rubs, or gallops,no edema  Respiratory:  clear to auscultation bilaterally, normal work of breathing GI: soft, nontender, nondistended, + BS Helen: no deformity or atrophy Skin: warm and dry, no rash Neuro:  Strength and sensation are intact Psych: euthymic mood, full affect   Recent Labs: 04/09/2022: ALT 11; BUN 11; Creatinine, Ser 0.53; Hemoglobin 14.1; Magnesium 1.8; Platelets 351; Potassium 3.8; Sodium 140; TSH 1.480    Lipid Panel Lab Results  Component Value Date   CHOL 122 03/29/2015   HDL 21 (L) 03/29/2015   LDLCALC 59 03/29/2015   TRIG 211 (H) 03/29/2015      Wt Readings from Last 3 Encounters:  04/26/22 152 lb 6 oz (69.1 kg)  04/09/22 145 lb (65.8 kg)  12/21/18 171 lb 8 oz (77.8 kg)       ASSESSMENT AND PLAN:  Problem List Items Addressed This Visit    None Visit Diagnoses     Paroxysmal tachycardia (HCC)    -  Primary   Relevant Medications   diltiazem (CARDIZEM CD) 180 MG 24 hr capsule   Other Relevant Orders   EKG 12-Lead   Essential hypertension       Relevant Medications   diltiazem (CARDIZEM CD) 180 MG 24 hr capsule   Esophageal atresia          Paroxysmal tachycardia In the setting of GI distress, unable to swallow, evaluated in the emergency room Long history of esophageal atresia, certain foods tend to stick such as Pasta When seen in the emergency room was in distress, noted to be in sinus tachycardia, improvement in symptoms with GI  cocktail Also started on diltiazem extended release 180 daily Recommend she continue the diltiazem at current dose for now Reports blood pressure stable heart rate stable She does have headache but she stopped drinking coffee We will continue current dose of diltiazem No further testing needed The calcium channel blocker as above should assist with her esophageal atresia  Essential hypertension Blood pressure is well controlled on today's visit. No changes made to the medications.  Esophageal atresia Long history of disorder, will report food will stick such as Posta causing significant pain as detailed on recent trip to the emergency room.   Total encounter time more than 50 minutes  Greater than 50% was spent in counseling and coordination of care with the patient    Signed, Dossie Arbour, M.D., Ph.D. [facility] [place], [place] [phone]

## 2022-04-26 ENCOUNTER — Encounter: Payer: Self-pay | Admitting: Cardiovascular Disease

## 2022-04-26 ENCOUNTER — Ambulatory Visit: Payer: 59 | Attending: Cardiovascular Disease | Admitting: Cardiovascular Disease

## 2022-04-26 VITALS — BP 120/80 | HR 82 | Ht 68.0 in | Wt 152.4 lb

## 2022-04-26 DIAGNOSIS — I10 Essential (primary) hypertension: Secondary | ICD-10-CM | POA: Diagnosis not present

## 2022-04-26 DIAGNOSIS — I479 Paroxysmal tachycardia, unspecified: Secondary | ICD-10-CM | POA: Diagnosis not present

## 2022-04-26 DIAGNOSIS — Q390 Atresia of esophagus without fistula: Secondary | ICD-10-CM

## 2022-04-26 MED ORDER — DILTIAZEM HCL ER COATED BEADS 180 MG PO CP24: 180.0000 mg | ORAL_CAPSULE | Freq: Every day | ORAL | 6 refills | Status: DC

## 2022-04-26 NOTE — Patient Instructions (Signed)
Medication Instructions:  No changes  If you need a refill on your cardiac medications before your next appointment, please call your pharmacy.    Lab work: No new labs needed   Testing/Procedures: No new testing needed   Follow-Up: At [facility], you and your health needs are our priority.  As part of our continuing mission to provide you with exceptional heart care, we have created designated Provider Care Teams.  These Care Teams include your primary Cardiologist (physician) and Advanced Practice Providers (APPs -  Physician Assistants and Nurse Practitioners) who all work together to provide you with the care you need, when you need it.  You will need a follow up appointment in 6 months  Providers on your designated Care Team:   Christopher Berge, NP Ryan Dunn, PA-C Cadence Furth, PA-C  COVID-19 Vaccine Information can be found at: [email or web address] For questions related to vaccine distribution or appointments, please email [email or web address] or call [phone].   

## 2022-07-15 ENCOUNTER — Encounter: Payer: Self-pay | Admitting: Emergency Medicine

## 2022-07-15 ENCOUNTER — Emergency Department: Payer: 59

## 2022-07-15 DIAGNOSIS — R1013 Epigastric pain: Secondary | ICD-10-CM | POA: Diagnosis not present

## 2022-07-15 DIAGNOSIS — R1012 Left upper quadrant pain: Secondary | ICD-10-CM | POA: Diagnosis not present

## 2022-07-15 DIAGNOSIS — I110 Hypertensive heart disease with heart failure: Secondary | ICD-10-CM | POA: Insufficient documentation

## 2022-07-15 DIAGNOSIS — I5032 Chronic diastolic (congestive) heart failure: Secondary | ICD-10-CM | POA: Diagnosis not present

## 2022-07-15 LAB — CBC
HCT: 42.4 % (ref 36.0–46.0)
Hemoglobin: 14.2 g/dL (ref 12.0–15.0)
MCH: 30 pg (ref 26.0–34.0)
MCHC: 33.5 g/dL (ref 30.0–36.0)
MCV: 89.5 fL (ref 80.0–100.0)
Platelets: 367 10*3/uL (ref 150–400)
RBC: 4.74 MIL/uL (ref 3.87–5.11)
RDW: 12.9 % (ref 11.5–15.5)
WBC: 16.4 10*3/uL — ABNORMAL HIGH (ref 4.0–10.5)
nRBC: 0 % (ref 0.0–0.2)

## 2022-07-15 NOTE — ED Triage Notes (Addendum)
Pt presents via POV with complaints of epigastric pain with radiation to the left side of her chest that started around 1900 tonight. Pt states last time she had these sx she was diagnosed with pancreatitis. She notes that the pain started immediately after eating dinner and she has vomited 3 times. No meds taken PTA. Denies SOB and ETOH use.

## 2022-07-16 ENCOUNTER — Emergency Department
Admission: EM | Admit: 2022-07-16 | Discharge: 2022-07-16 | Disposition: A | Discharge status: Home / Self Care | Payer: 59 | Attending: Emergency Medicine | Admitting: Emergency Medicine

## 2022-07-16 ENCOUNTER — Other Ambulatory Visit: Payer: Self-pay

## 2022-07-16 ENCOUNTER — Emergency Department: Payer: 59

## 2022-07-16 DIAGNOSIS — R1013 Epigastric pain: Secondary | ICD-10-CM

## 2022-07-16 LAB — TROPONIN I (HIGH SENSITIVITY)
Troponin I (High Sensitivity): 4 ng/L (ref <18)
Troponin I (High Sensitivity): 5 ng/L (ref <18)

## 2022-07-16 LAB — COMPREHENSIVE METABOLIC PANEL
ALT: 14 U/L (ref 0–44)
AST: 14 U/L — ABNORMAL LOW (ref 15–41)
Albumin: 4.3 g/dL (ref 3.5–5.0)
Alkaline Phosphatase: 53 U/L (ref 38–126)
Anion gap: 10 (ref 5–15)
BUN: 14 mg/dL (ref 6–20)
CO2: 21 mmol/L — ABNORMAL LOW (ref 22–32)
Calcium: 9.3 mg/dL (ref 8.9–10.3)
Chloride: 109 mmol/L (ref 98–111)
Creatinine, Ser: 0.78 mg/dL (ref 0.44–1.00)
GFR, Estimated: >60 mL/min (ref >60)
Glucose, Bld: 141 mg/dL — ABNORMAL HIGH (ref 70–99)
Potassium: 3.6 mmol/L (ref 3.5–5.1)
Sodium: 140 mmol/L (ref 135–145)
Total Bilirubin: 0.8 mg/dL (ref 0.3–1.2)
Total Protein: 7.7 g/dL (ref 6.5–8.1)

## 2022-07-16 LAB — URINALYSIS, ROUTINE W REFLEX MICROSCOPIC
Bacteria, UA: NONE SEEN
Bilirubin Urine: NEGATIVE
Glucose, UA: NEGATIVE mg/dL
Hgb urine dipstick: NEGATIVE
Ketones, ur: NEGATIVE mg/dL
Leukocytes,Ua: NEGATIVE
Nitrite: NEGATIVE
Protein, ur: 100 mg/dL — AB
Specific Gravity, Urine: 1.023 (ref 1.005–1.030)
pH: 5 (ref 5.0–8.0)

## 2022-07-16 LAB — POC URINE PREG, ED: Preg Test, Ur: NEGATIVE

## 2022-07-16 LAB — LIPASE, BLOOD: Lipase: 39 U/L (ref 11–51)

## 2022-07-16 MED ORDER — IOHEXOL 300 MG/ML  SOLN
100.0000 mL | Freq: Once | INTRAMUSCULAR | Status: AC | PRN
  Administered 2022-07-16: 100 mL via INTRAVENOUS

## 2022-07-16 MED ORDER — LACTATED RINGERS IV BOLUS
1000.0000 mL | Freq: Once | INTRAVENOUS | Status: AC
  Administered 2022-07-16: 1000 mL via INTRAVENOUS

## 2022-07-16 MED ORDER — ONDANSETRON HCL 4 MG/2ML IJ SOLN
4.0000 mg | Freq: Once | INTRAMUSCULAR | Status: AC
  Administered 2022-07-16: 4 mg via INTRAVENOUS
  Filled 2022-07-16: qty 2

## 2022-07-16 MED ORDER — PANTOPRAZOLE SODIUM 40 MG PO TBEC: 40.0000 mg | DELAYED_RELEASE_TABLET | Freq: Every day | ORAL | 0 refills | Status: AC

## 2022-07-16 MED ORDER — HYDROMORPHONE HCL 1 MG/ML IJ SOLN
1.0000 mg | Freq: Once | INTRAMUSCULAR | Status: AC
  Administered 2022-07-16: 1 mg via INTRAVENOUS
  Filled 2022-07-16: qty 1

## 2022-07-16 MED ORDER — METOCLOPRAMIDE HCL 10 MG PO TABS: 10.0000 mg | ORAL_TABLET | Freq: Three times a day (TID) | ORAL | 0 refills | Status: AC

## 2022-07-16 NOTE — ED Provider Notes (Signed)
[facility] Provider Note    Event Date/Time   First MD Initiated Contact with Patient 07/16/22 0201     (approximate)   History   Abdominal Pain   HPI  Helen Burgess is a 46 y.o. female past medical history of multiple prior abdominal surgeries, ASD status post surgery, SBO who presents with abdominal pain.  Symptoms started after eating tonight.  Pain is located in the epigastric region radiates to the left upper quadrant feels full in the left upper quadrant.  She has vomited 3 times.  Pain feels better with sitting still worsens with walking.  No fever chills or urinary symptoms.  Feels similar to her pancreatitis in the past.     Past Medical History:  Diagnosis Date   ASD (atrial septal defect)    Bowel obstruction (HCC)    CHF (congestive heart failure) (HCC)    Dermatillomania    Hx SBO    Migraines    Occipital neuralgia    Panic attacks     Patient Active Problem List   Diagnosis Date Noted   Syncope 03/30/2018   Lymphedema 03/30/2018   Hypokalemia 03/21/2018   Chronic diastolic heart failure (HCC) 01/06/2018   HTN (hypertension) 01/06/2018   Panic attacks 01/06/2018   Acute pancreatitis 03/28/2015     Physical Exam  Triage Vital Signs: ED Triage Vitals  Enc Vitals Group     BP 07/15/22 2327 (!) 146/100     Pulse Rate 07/15/22 2327 (!) 109     Resp 07/15/22 2327 20     Temp 07/15/22 2327 98.7 F (37.1 C)     Temp Source 07/15/22 2327 Oral     SpO2 07/15/22 2327 100 %     Weight 07/15/22 2325 155 lb (70.3 kg)     Height 07/15/22 2325 5\' 8"  (1.727 m)     Head Circumference --      Peak Flow --      Pain Score 07/15/22 2324 9     Pain Loc --      Pain Edu? --      Excl. in GC? --     Most recent vital signs: Vitals:   07/16/22 0630 07/16/22 0636  BP: 122/80   Pulse: 89   Resp: 16   Temp:  97.7 F (36.5 C)  SpO2: 96%      General: Awake, patient intermittently grimacing CV:  Good peripheral perfusion.   Resp:  Normal effort.  Abd:  No distention.  Multiple prior abdominal surgical scars, significantly tender to palpation in the epigastric region and left upper quadrant with voluntary guarding Neuro:             Awake, Alert, Oriented x 3  Other:     ED Results / Procedures / Treatments  Labs (all labs ordered are listed, but only abnormal results are displayed) Labs Reviewed  COMPREHENSIVE METABOLIC PANEL - Abnormal; Notable for the following components:      Result Value   CO2 21 (*)    Glucose, Bld 141 (*)    AST 14 (*)    All other components within normal limits  CBC - Abnormal; Notable for the following components:   WBC 16.4 (*)    All other components within normal limits  URINALYSIS, ROUTINE W REFLEX MICROSCOPIC - Abnormal; Notable for the following components:   Color, Urine YELLOW (*)    APPearance HAZY (*)    Protein, ur 100 (*)  All other components within normal limits  LIPASE, BLOOD  POC URINE PREG, ED  TROPONIN I (HIGH SENSITIVITY)  TROPONIN I (HIGH SENSITIVITY)     EKG     RADIOLOGY Reviewed and interpreted patient's CT abdomen pelvis which shows dilation of the bowel that is now her esophagus   PROCEDURES:  Critical Care performed: No  Procedures  The patient is on the cardiac monitor to evaluate for evidence of arrhythmia and/or significant heart rate changes.   MEDICATIONS ORDERED IN ED: Medications  lactated ringers bolus 1,000 mL (1,000 mLs Intravenous New Bag/Given 07/16/22 0304)  HYDROmorphone (DILAUDID) injection 1 mg (1 mg Intravenous Given 07/16/22 0305)  ondansetron (ZOFRAN) injection 4 mg (4 mg Intravenous Given 07/16/22 0305)  iohexol (OMNIPAQUE) 300 MG/ML solution 100 mL (100 mLs Intravenous Contrast Given 07/16/22 0323)     IMPRESSION / MDM / ASSESSMENT AND PLAN / ED COURSE  I reviewed the triage vital signs and the nursing notes.                              Patient's presentation is most consistent with acute  presentation with potential threat to life or bodily function.  Differential diagnosis includes, but is not limited to, gastritis, pancreatitis, CBD stone, perforated viscus, bowel obstruction  Patient is a 46 year old female with complex past medical history of multiple abdominal surgeries and prior ASD status postrepair presents with upper abdominal pain started after eating today.  She has had 3 episodes of emesis.  Pain worse with movement.  On exam she does intermittently look uncomfortable grimacing.  She is tender in the upper abdomen specifically the left upper quadrant with voluntary guarding.  Labs notable for leukocytosis 16 otherwise reassuring troponin negative lipase and LFTs are normal.  UA not suggestive of infection.  Given her multiple prior abdominal surgeries and her exam we will obtain a CT of the abdomen pelvis.  Will treat with IV opiates and fluid bolus and Zofran.  CT abdomen and pelvis read by radiology as distention of the distal bowel segment and narrowing of the anastomotic site from her bowel interposition for esophageal atresia.  Discussed with Dr. Aleen Campi who does not really feel comfortable making clinical decisions based on her imaging as he is not as familiar with the surgery.  Recommended calling tertiary care center.  Patient had her surgery [facility] at [facility].  Called [facility] spoke with general surgery they also are not the ones who performed the surgery and recommended reaching out to CT surgery but recommend doing so after 6 AM.  Patient is currently resting comfortably.  Does endorse ongoing pain but no longer vomiting.  Discussed with CT surgery Dr. Merrie Roof at [facility].  He reviewed the images does not feel that there is any surgical lesion.  I also compared her images in their system to imaging today and there is not a significant difference.  Does note that her stomach is more dilated.  He thinks it is appropriate for her to follow-up as an outpatient versus having her be  admitted for IV fluids and GI consult if not tolerating p.o.   On reassessment patient is tolerating fluids.  Does continue to have some retrosternal pain but no vomiting.  This point I think that she is appropriate for outpatient follow-up.  Recommended clear liquid diet for several days as well as prescribed Protonix and Reglan.  We discussed return precautions.   FINAL CLINICAL IMPRESSION(S) / ED  DIAGNOSES   Final diagnoses:  Epigastric abdominal pain     Rx / DC Orders   ED Discharge Orders          Ordered    pantoprazole (PROTONIX) 40 MG tablet  Daily        07/16/22 0714    metoCLOPramide (REGLAN) 10 MG tablet  3 times daily with meals        07/16/22 6294             Note:  This document was prepared using Dragon voice recognition software and may include unintentional dictation errors.   Georga Hacking, MD 07/16/22 [phone]

## 2022-07-16 NOTE — Discharge Instructions (Signed)
Your CAT scan showed that your stomach was very dilated.  Please stick to a clear liquid diet over the next 2 to 3 days until you are feeling improved.  Please start taking the Protonix and you can also take Reglan to help with motility and with nausea and vomiting.  If your pain is worsening or you are unable to keep liquids down please return to an emergency department.  Follow-up with cardiothoracic surgery Dr. Jacqulyn Bath with [facility].

## 2022-07-16 NOTE — ED Notes (Signed)
[facility] paged for Dr. Sidney Ace

## 2022-10-14 ENCOUNTER — Other Ambulatory Visit: Payer: Self-pay

## 2022-10-14 ENCOUNTER — Emergency Department
Admission: EM | Admit: 2022-10-14 | Discharge: 2022-10-15 | Disposition: A | Discharge status: Home / Self Care | Payer: 59 | Attending: Emergency Medicine | Admitting: Emergency Medicine

## 2022-10-14 ENCOUNTER — Encounter: Payer: Self-pay | Admitting: Emergency Medicine

## 2022-10-14 DIAGNOSIS — R748 Abnormal levels of other serum enzymes: Secondary | ICD-10-CM | POA: Diagnosis not present

## 2022-10-14 DIAGNOSIS — R1013 Epigastric pain: Secondary | ICD-10-CM | POA: Insufficient documentation

## 2022-10-14 DIAGNOSIS — R1012 Left upper quadrant pain: Secondary | ICD-10-CM | POA: Insufficient documentation

## 2022-10-14 DIAGNOSIS — D72829 Elevated white blood cell count, unspecified: Secondary | ICD-10-CM | POA: Diagnosis not present

## 2022-10-14 DIAGNOSIS — E876 Hypokalemia: Secondary | ICD-10-CM | POA: Insufficient documentation

## 2022-10-14 LAB — URINALYSIS, ROUTINE W REFLEX MICROSCOPIC
Bilirubin Urine: NEGATIVE
Glucose, UA: NEGATIVE mg/dL
Hgb urine dipstick: NEGATIVE
Ketones, ur: NEGATIVE mg/dL
Leukocytes,Ua: NEGATIVE
Nitrite: NEGATIVE
Protein, ur: NEGATIVE mg/dL
Specific Gravity, Urine: 1.001 — ABNORMAL LOW (ref 1.005–1.030)
pH: 6 (ref 5.0–8.0)

## 2022-10-14 LAB — TROPONIN I (HIGH SENSITIVITY): Troponin I (High Sensitivity): 8 ng/L (ref <18)

## 2022-10-14 LAB — POC URINE PREG, ED: Preg Test, Ur: NEGATIVE

## 2022-10-14 LAB — COMPREHENSIVE METABOLIC PANEL
ALT: 17 U/L (ref 0–44)
AST: 31 U/L (ref 15–41)
Albumin: 4.3 g/dL (ref 3.5–5.0)
Alkaline Phosphatase: 67 U/L (ref 38–126)
Anion gap: 15 (ref 5–15)
BUN: 20 mg/dL (ref 6–20)
CO2: 22 mmol/L (ref 22–32)
Calcium: 9.8 mg/dL (ref 8.9–10.3)
Chloride: 99 mmol/L (ref 98–111)
Creatinine, Ser: 0.7 mg/dL (ref 0.44–1.00)
GFR, Estimated: >60 mL/min (ref >60)
Glucose, Bld: 168 mg/dL — ABNORMAL HIGH (ref 70–99)
Potassium: 3.2 mmol/L — ABNORMAL LOW (ref 3.5–5.1)
Sodium: 136 mmol/L (ref 135–145)
Total Bilirubin: 0.6 mg/dL (ref 0.3–1.2)
Total Protein: 7.7 g/dL (ref 6.5–8.1)

## 2022-10-14 LAB — CBC
HCT: 42.8 % (ref 36.0–46.0)
Hemoglobin: 14.1 g/dL (ref 12.0–15.0)
MCH: 29.1 pg (ref 26.0–34.0)
MCHC: 32.9 g/dL (ref 30.0–36.0)
MCV: 88.4 fL (ref 80.0–100.0)
Platelets: 398 10*3/uL (ref 150–400)
RBC: 4.84 MIL/uL (ref 3.87–5.11)
RDW: 13.2 % (ref 11.5–15.5)
WBC: 19.7 10*3/uL — ABNORMAL HIGH (ref 4.0–10.5)
nRBC: 0 % (ref 0.0–0.2)

## 2022-10-14 LAB — LIPASE, BLOOD: Lipase: 127 U/L — ABNORMAL HIGH (ref 11–51)

## 2022-10-14 NOTE — ED Triage Notes (Signed)
EMS brings pt in from home for c/o abd pain and distention since 2pm today

## 2022-10-14 NOTE — ED Provider Notes (Signed)
[facility] Provider Note    Event Date/Time   First MD Initiated Contact with Patient 10/14/22 2329     (approximate)   History   Abdominal Pain   HPI  Helen Burgess is a 47 y.o. female who presents to the ED for evaluation of Abdominal Pain   I reviewed 2 separate ED visits from 07/2011/2, evaluated for similar upper abdominal pain.  Has a history of multiple abdominal surgeries, but no SBO and discharged home after improving symptoms with Reglan. Also reviewed [facility] outpatient colonoscopy/endoscopy from about 2 weeks ago.  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  Normal colonoscopy  Patient presents to the ED for evaluation of "another episode of this pain."  She reports intermittent pain for the past 1 year that occurs/flares up every week or 2.  She reports that she does not always come to the ED to evaluate these symptoms but symptoms today are consistent with all these previous episodes.  She reports pain starting around 2 PM today and has been waxing and waning with intermittent epigastric and LUQ pain over these past 10 hours.  She reports eating after the pain started with some worsening of the pain with some nausea but no emesis.  Reports a "normal" bowel movement this morning prior to initiation of pain.  Reports uncertainty regarding the passage of gas today as she has not noticed anything abnormal.  No fevers, urinary changes.  She reports that she is on prednisone for a URI prescribed by an urgent care recently.  Physical Exam   Triage Vital Signs: ED Triage Vitals  Enc Vitals Group     BP 10/14/22 2046 (!) 164/104     Pulse Rate 10/14/22 2046 90     Resp 10/14/22 2046 18     Temp 10/14/22 2046 98.3 F (36.8 C)     Temp Source 10/14/22 2046 Oral     SpO2 10/14/22 2030 98 %     Weight 10/14/22 2046 155 lb (70.3 kg)     Height 10/14/22 2046 '5\' 8"'$   (1.727 m)     Head Circumference --      Peak Flow --      Pain Score 10/14/22 2046 8     Pain Loc --      Pain Edu? --      Excl. in [place]? --     Most recent vital signs: Vitals:   10/15/22 0000 10/15/22 0131  BP: (!) 177/114   Pulse: 78   Resp: 18   Temp:  97.7 F (36.5 C)  SpO2: 97%     General: Awake, no distress.  CV:  Good peripheral perfusion.  Resp:  Normal effort.  Abd:  No distention.  Epigastric and LUQ tenderness with some voluntary guarding on deeper palpations.  Lower abdomen is benign. MSK:  No deformity noted.  Neuro:  No focal deficits appreciated. Other:     ED Results / Procedures / Treatments   Labs (all labs ordered are listed, but only abnormal results are displayed) Labs Reviewed  LIPASE, BLOOD - Abnormal; Notable for the following components:      Result Value   Lipase 127 (*)    All other components within normal limits  COMPREHENSIVE METABOLIC PANEL - Abnormal; Notable for the following components:   Potassium 3.2 (*)    Glucose, Bld 168 (*)    All other components within  normal limits  CBC - Abnormal; Notable for the following components:   WBC 19.7 (*)    All other components within normal limits  URINALYSIS, ROUTINE W REFLEX MICROSCOPIC - Abnormal; Notable for the following components:   Color, Urine COLORLESS (*)    APPearance CLEAR (*)    Specific Gravity, Urine 1.001 (*)    All other components within normal limits  POC URINE PREG, ED  TROPONIN I (HIGH SENSITIVITY)  TROPONIN I (HIGH SENSITIVITY)    EKG Sinus rhythm with a rate of 86 bpm.  Normal axis and intervals.  Nonspecific ST changes inferiorly and laterally without STEMI.  RADIOLOGY CT abdomen/pelvis interpreted me without SBO  Official radiology report(s): CT ABDOMEN PELVIS W CONTRAST  Result Date: 10/15/2022 CLINICAL DATA:  Epigastric pain that radiates to the back. This has been going on and off since April recent endoscopy/colonoscopy that showed a lesion near the  pancreas. 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 EXAM: CT ABDOMEN AND PELVIS WITH CONTRAST TECHNIQUE: Multidetector CT imaging of the abdomen and pelvis was performed using the standard protocol following bolus administration of intravenous contrast. RADIATION DOSE REDUCTION: This exam was performed according to the departmental dose-optimization program which includes automated exposure control, adjustment of the mA and/or kV according to patient size and/or use of iterative reconstruction technique. CONTRAST:  137m OMNIPAQUE IOHEXOL 300 MG/ML  SOLN COMPARISON:  CT 07/16/2022 FINDINGS: Lower chest: Bibasilar scarring/atelectasis.  No acute abnormality. Hepatobiliary: Hepatic steatosis. Cholecystectomy. No biliary dilation. Pancreas: Unremarkable. Spleen: Unremarkable. Adrenals/Urinary Tract: Normal adrenal glands. No urinary calculi or hydronephrosis. Distended bladder. Stomach/Bowel: Postsurgical changes consistent with bowel resection and colonic interposition for esophageal atresia. Normal caliber large and small bowel. No bowel wall thickening. Normal appendix. Vascular/Lymphatic: No significant vascular findings are present. No enlarged abdominal or pelvic lymph nodes. Reproductive: Uterus and bilateral adnexa are unremarkable. Other: No free intraperitoneal fluid or air. Musculoskeletal: No acute or significant osseous findings. IMPRESSION: 1. No acute abnormality in the abdomen or pelvis. 2. Postsurgical changes consistent with bowel resection and colonic interposition for esophageal atresia. 3. Hepatic steatosis. Electronically Signed   By: TPlacido SouM.D.   On: 10/15/2022 00:55    PROCEDURES and INTERVENTIONS:  .1-3 Lead EKG Interpretation  Performed by: SVladimir Crofts MD Authorized by: SVladimir Crofts MD     Interpretation: normal     ECG rate:  76   ECG rate assessment: normal     Rhythm: sinus rhythm     Ectopy: none     Conduction: normal      Medications  metoCLOPramide (REGLAN) injection 10 mg (10 mg Intravenous Given 10/15/22 0109)  lactated ringers bolus 1,000 mL (0 mLs Intravenous Stopped 10/15/22 0242)  HYDROmorphone (DILAUDID) injection 1 mg (1 mg Intravenous Given 10/15/22 0109)  iohexol (OMNIPAQUE) 300 MG/ML solution 100 mL (100 mLs Intravenous Contrast Given 10/15/22 0029)  dicyclomine (BENTYL) capsule 10 mg (10 mg Oral Given 10/15/22 0253)     IMPRESSION / MDM / ASSESSMENT AND PLAN / ED COURSE  I reviewed the triage vital signs and the nursing notes.  Differential diagnosis includes, but is not limited to, SBO, ileus, GERD, gastritis, upper GI bleeding, pancreatitis  {Patient presents with symptoms of an acute illness or injury that is potentially life-threatening.  47year old woman presents with chronic intermittent abdominal discomfort with an extensive surgical history, but without evidence of acute features today and suitable for outpatient management.  Upper abdominal tenderness without peritoneal features.  Otherwise looks well.  Blood  work with leukocytosis, but she was recently prescribed prednisone for a URI.  Urine without infectious features and metabolic panel with mild hypokalemia.  Troponin is negative and lipase is marginally elevated which could be contributing to her symptoms.  CT without acute features, and redemonstrating her known surgical changes.  Her pain is well-controlled and she is tolerating p.o. intake.  Considering the chronic intermittent nature and her reporting that she "sits in the toilet all morning" we discussed the possibility of IBS and started her on Bentyl empirically.  We discussed GI follow-up and return precautions for the ED.  Clinical Course as of 10/15/22 [id number]  Fri Oct 15, 2022  0233 reassessed [DS]  0234 Bentyl and famotidine [DS]    Clinical Course User Index [DS] Vladimir Crofts, MD     FINAL CLINICAL IMPRESSION(S) / ED DIAGNOSES   Final diagnoses:  Epigastric pain      Rx / DC Orders   ED Discharge Orders          Ordered    dicyclomine (BENTYL) 10 MG capsule  3 times daily before meals & bedtime        10/15/22 0302             Note:  This document was prepared using Dragon voice recognition software and may include unintentional dictation errors.   Vladimir Crofts, MD 10/15/22 [phone]

## 2022-10-14 NOTE — ED Triage Notes (Signed)
  Patient BIB EMS for epigastric pain that radiates to back.  Patient states this has been going off and on since April 2023.  Patient states she recently had endoscopy/colonoscopy that showed a lesion near her pancreas.  Patient endorses bloating feeling at epigastric area and sharp/stabbing pain.  Pain 8/10.  No N/V.

## 2022-10-15 ENCOUNTER — Emergency Department: Payer: 59

## 2022-10-15 LAB — TROPONIN I (HIGH SENSITIVITY): Troponin I (High Sensitivity): 13 ng/L (ref <18)

## 2022-10-15 MED ORDER — HYDROMORPHONE HCL 1 MG/ML IJ SOLN
1.0000 mg | Freq: Once | INTRAMUSCULAR | Status: AC
  Administered 2022-10-15: 1 mg via INTRAVENOUS
  Filled 2022-10-15: qty 1

## 2022-10-15 MED ORDER — DICYCLOMINE HCL 10 MG PO CAPS: 10.0000 mg | ORAL_CAPSULE | Freq: Three times a day (TID) | ORAL | 1 refills | Status: AC

## 2022-10-15 MED ORDER — LACTATED RINGERS IV BOLUS
1000.0000 mL | Freq: Once | INTRAVENOUS | Status: AC
  Administered 2022-10-15: 1000 mL via INTRAVENOUS

## 2022-10-15 MED ORDER — IOHEXOL 300 MG/ML  SOLN
100.0000 mL | Freq: Once | INTRAMUSCULAR | Status: AC | PRN
  Administered 2022-10-15: 100 mL via INTRAVENOUS

## 2022-10-15 MED ORDER — DICYCLOMINE HCL 10 MG PO CAPS
10.0000 mg | ORAL_CAPSULE | Freq: Once | ORAL | Status: AC
  Administered 2022-10-15: 10 mg via ORAL
  Filled 2022-10-15: qty 1

## 2022-10-15 MED ORDER — METOCLOPRAMIDE HCL 5 MG/ML IJ SOLN
10.0000 mg | Freq: Once | INTRAMUSCULAR | Status: AC
  Administered 2022-10-15: 10 mg via INTRAVENOUS
  Filled 2022-10-15: qty 2

## 2022-10-15 NOTE — Discharge Instructions (Addendum)
I would continue taking the famotidine/Pepcid medicine as this can be helpful for acid reflux/stomach ulcers and is quite safe.  You can also try starting dicyclomine/Bentyl.  This is an antispasmodic to help with irritable bowel syndrome.  Can take up to 4 times daily and is safe to take alongside the famotidine.

## 2022-10-25 ENCOUNTER — Ambulatory Visit: Payer: 59 | Admitting: Cardiovascular Disease

## 2022-11-28 ENCOUNTER — Encounter: Payer: Self-pay | Admitting: Emergency Medicine

## 2022-11-28 ENCOUNTER — Ambulatory Visit
Admission: EM | Admit: 2022-11-28 | Discharge: 2022-11-28 | Disposition: A | Discharge status: Home / Self Care | Payer: 59

## 2022-11-28 DIAGNOSIS — M62838 Other muscle spasm: Secondary | ICD-10-CM | POA: Diagnosis not present

## 2022-11-28 DIAGNOSIS — S161XXA Strain of muscle, fascia and tendon at neck level, initial encounter: Secondary | ICD-10-CM

## 2022-11-28 MED ORDER — CYCLOBENZAPRINE HCL 10 MG PO TABS: 10.0000 mg | ORAL_TABLET | Freq: Three times a day (TID) | ORAL | 0 refills | Status: AC | PRN

## 2022-11-28 NOTE — Discharge Instructions (Signed)
NECK PAIN: Stressed avoiding painful activities. This can exacerbate your symptoms and make them worse.  May apply heat to the areas of pain for some relief. Use medications as directed. Be aware of which medications make you drowsy and do not drive or operate any kind of heavy machinery while using the medication (ie pain medications or muscle relaxers). F/U with PCP for reexamination or return sooner if condition worsens or does not begin to improve over the next few days.   NECK PAIN RED FLAGS: If symptoms get worse than they are right now, you should come back sooner for re-evaluation. If you have increased numbness/ tingling or notice that the numbness/tingling is affecting the legs or saddle region, go to ER. If you ever lose continence go to ER.     

## 2022-11-28 NOTE — ED Triage Notes (Signed)
Patient c/o headache and neck pain that started a week ago.  Patient states that she has been working longer hours and under more stress.  Patient reports pain at the base of her skull and spasms in her upper neck.  Patient also tried OTC creams and heat with no relief.  Patient denies injury or fall.  Patient took Tylenol and Aleeve around 11 am this morning. Patient denies N/V.

## 2022-11-28 NOTE — ED Provider Notes (Signed)
[facility]    CSN: [id number] Arrival date & time: 11/28/22  1501      History   Chief Complaint Chief Complaint  Patient presents with   Headache   Neck Pain    HPI Helen Burgess is a 47 y.o. female presenting for right-sided neck pain and stiffness for the past 1 week.  Denies any injury.  Reports that she works from home on a computer.  States that she has been working longer hours than normal.  Patient believes she strained her neck.  She reports that she also clenches her jaw and this caused her to have muscle spasms on the right side of her neck.  She says this has happened before.  She has been trying everything at home including over-the-counter muscle rubs, Tylenol, Aleve, stretching and using heat and ice.  She says none of that has really helped.  She reports that in the past she has received Flexeril and that has helped greatly.  No other complaints.  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      Home Medications    Prior to Admission medications   Medication Sig Start Date End Date Taking? Authorizing Provider  Azelaic Acid 15 % gel Apply 1 Application topically 2 (two) times daily. 11/25/22  Yes [provider]  clindamycin (CLEOCIN T) 1 % external solution Apply topically.  11/25/22  Yes [provider]  cyclobenzaprine (FLEXERIL) 10 MG tablet Take 1 tablet (10 mg total) by mouth 3 (three) times daily as needed for muscle spasms. 11/28/22  Yes Shirlee Latch, PA-C  diltiazem (CARDIZEM CD) 180 MG 24 hr capsule Take 1 capsule (180 mg total) by mouth daily. 04/26/22  Yes Gollan, Tollie Pizza, MD  doxycycline (VIBRAMYCIN) 100 MG capsule Take 100 mg by mouth 2 (two) times daily. 11/25/22  Yes [provider]  famotidine (PEPCID) 20 MG tablet Take 20 mg by mouth 2 (two) times daily.   Yes [provider]  triazolam (HALCION) 0.25 MG tablet SMARTSIG:2 Tablet(s) By Mouth 09/02/22  Yes [provider]  acetaminophen (TYLENOL) 500 MG tablet Take 500 mg by mouth every 6 (six) hours as needed.    [provider]  AJOVY 225 MG/1.5ML SOSY Take 1 mL by mouth every 30 (thirty) days. Patient not taking: Reported on 04/26/2022 12/22/17   [provider]  aluminum-magnesium hydroxide-simethicone (MAALOX) 200-200-20 MG/5ML SUSP Take 30 mLs by mouth 4 (four) times daily -  before meals and at bedtime. Patient not taking: Reported on 04/26/2022 04/09/22   Sharman Cheek, MD  buprenorphine-naloxone (SUBOXONE) 8-2 mg SUBL SL tablet Place 1.5 tablets under the tongue daily. Patient not taking: Reported  on 04/26/2022 01/16/22   [provider]  diazepam (VALIUM) 5 MG tablet Take 5 mg by mouth at bedtime as needed for anxiety.  Patient not taking: Reported on 04/26/2022    [provider]  dicyclomine (BENTYL) 10 MG capsule Take 1 capsule (10 mg total) by mouth 4 (four) times daily -  before meals and at bedtime. 10/15/22   Delton Prairie, MD  escitalopram (LEXAPRO) 20 MG tablet Take 20 mg by mouth daily. Patient not taking: Reported on 04/26/2022    [provider]  gabapentin (NEURONTIN) 300 MG capsule Take 300 mg by mouth 3 (three) times daily.  Patient not taking: Reported on 04/26/2022    [provider]  lidocaine  (XYLOCAINE) 2 % solution Use as directed 10 mLs in the mouth or throat every 4 (four) hours as needed (throat pain). Swallow as needed Patient not taking: Reported on 04/26/2022 04/09/22   Sharman Cheek, MD  metoCLOPramide (REGLAN) 10 MG tablet Take 1 tablet (10 mg total) by mouth 3 (three) times daily with meals. 07/16/22 08/15/22  Georga Hacking, MD  morphine (MS CONTIN) 30 MG 12 hr tablet Take 30 mg by mouth 3 (three) times daily. Patient not taking: Reported on 04/26/2022    [provider]  naloxone [facility]) nasal spray 4 mg/0.1 mL Place 1 spray into the nose as needed. Patient not taking: Reported on 04/26/2022    [provider]  oxyCODONE-acetaminophen (PERCOCET) 10-325 MG tablet Take 1 tablet by mouth every 8 (eight) hours as needed for pain.  Patient not taking: Reported on 04/26/2022 12/23/17   [provider]  pantoprazole (PROTONIX) 40 MG tablet Take 1 tablet (40 mg total) by mouth daily. 07/16/22 08/15/22  Georga Hacking, MD  QUEtiapine (SEROQUEL) 50 MG tablet Take 50 mg by mouth 2 (two) times daily as needed.  Patient not taking: Reported on 04/26/2022    [provider]    Family History Family History  Problem Relation Age of Onset   Diabetes Neg Hx     Social History Social History   Tobacco Use   Smoking status: Never   Smokeless tobacco: Never  Vaping Use   Vaping Use: Never used  Substance Use Topics   Alcohol use: No   Drug use: No     Allergies   Metoprolol, Ibuprofen, Nsaids, and Ultram [tramadol]   Review of Systems Review of Systems  Eyes:  Negative for photophobia.  Musculoskeletal:  Positive for neck pain and neck stiffness. Negative for back pain.  Neurological:  Positive for headaches. Negative for dizziness, syncope, weakness and numbness.     Physical Exam Triage Vital Signs ED Triage Vitals  Enc Vitals Group     BP 11/28/22 1535 (!) 152/93     Pulse Rate 11/28/22 1535 70     Resp 11/28/22 1535  14     Temp 11/28/22 1535 97.9 F (36.6 C)     Temp Source 11/28/22 1535 Oral     SpO2 11/28/22 1535 98 %     Weight 11/28/22 1531 165 lb (74.8 kg)     Height 11/28/22 1531 5\' 8"  (1.727 m)     Head Circumference --      Peak Flow --      Pain Score 11/28/22 1531 6     Pain Loc --      Pain Edu? --      Excl. in [place]? --    No data found.  Updated Vital Signs  BP (!) 152/93 (BP Location: Left Arm)   Pulse 70   Temp 97.9 F (36.6 C) (Oral)   Resp 14   Ht  (1.727 m)   Wt 165 lb (74.8 kg)   LMP 11/25/2022 (Exact Date)   SpO2 98%   BMI 25.09 kg/m   Physical Exam Vitals and nursing note reviewed.  Constitutional:      General: She is not in acute distress.    Appearance: Normal appearance. She is not ill-appearing or toxic-appearing.  HENT:     Head: Normocephalic and atraumatic.     Nose: Nose normal.     Mouth/Throat:     Mouth: Mucous membranes are moist.     Pharynx: Oropharynx is clear.  Eyes:     General: No scleral icterus.       Right eye: No discharge.        Left eye: No discharge.     Conjunctiva/sclera: Conjunctivae normal.  Cardiovascular:     Rate and Rhythm: Normal rate and regular rhythm.     Heart sounds: Normal heart sounds.  Pulmonary:     Effort: Pulmonary effort is normal. No respiratory distress.     Breath sounds: Normal breath sounds.  Musculoskeletal:     Cervical back: Neck supple. No rigidity. Pain with movement and muscular tenderness (TTP right paravertebral muscle tenderness as well as TTP right trapezius) present. No spinous process tenderness. Decreased range of motion.  Skin:    General: Skin is dry.  Neurological:     General: No focal deficit present.     Mental Status: She is alert. Mental status is at baseline.     Motor: No weakness.     Gait: Gait normal.  Psychiatric:        Mood and Affect: Mood normal.        Behavior: Behavior normal.        Thought Content: Thought content normal.      UC Treatments / Results   Labs (all labs ordered are listed, but only abnormal results are displayed) Labs Reviewed - No data to display  EKG   Radiology No results found.  Procedures Procedures (including critical care time)  Medications Ordered in UC Medications - No data to display  Initial Impression / Assessment and Plan / UC Course  I have reviewed the triage vital signs and the nursing notes.  Pertinent labs & imaging results that were available during my care of the patient were reviewed by me and considered in my medical decision making (see chart for details).   47 year old female presents for right-sided neck pain and headache for the past week.  Reports that she works at a [employer] account [employer] and sits for long hours.  Also reports that she clenches her jaw due to stress.  Has been using multiple over-the-counter medications and creams as well as heat without relief.  Reports multiple x-rays of helped in the past.  On exam today she does have tenderness of the right paravertebral muscles and right trapezius.  Muscles are stiff.  Reduced range of motion especially with extension and flexion due to pain.  Exam otherwise normal.  Consistent with cervical strain and muscle spasms as well as suspect tension type headache.  Advised to continue with the over-the-counter methods she has been and use Flexeril as needed.  Continue supportive care.  Reviewed ED precautions.   Final Clinical Impressions(s) / UC Diagnoses   Final diagnoses:  Strain of neck muscle, initial encounter  Muscle spasms of neck     Discharge Instructions      NECK PAIN: Stressed avoiding painful activities. This can exacerbate your symptoms and make them worse.  May apply heat to the areas of pain for some relief. Use medications as directed. Be aware of which medications make you drowsy and do not drive or operate any kind of heavy machinery while using the medication (ie pain medications or muscle relaxers). F/U with PCP for  reexamination or return sooner if condition worsens or does not begin to improve over the next few days.   NECK PAIN RED FLAGS: If symptoms get worse than they are right now, you should come back sooner for re-evaluation. If you have increased numbness/ tingling or notice that the numbness/tingling is affecting the legs or saddle region, go to ER. If you ever lose continence go to ER.       ED Prescriptions     Medication Sig Dispense Auth. Provider   cyclobenzaprine (FLEXERIL) 10 MG tablet Take 1 tablet (10 mg total) by mouth 3 (three) times daily as needed for muscle spasms. 20 tablet Gareth Morgan      PDMP not reviewed this encounter.   Shirlee Latch, PA-C 11/28/22 1605

## 2022-12-15 ENCOUNTER — Emergency Department
Admission: EM | Admit: 2022-12-15 | Discharge: 2022-12-15 | Discharge status: Against Medical Advice | Payer: 59 | Attending: Emergency Medicine | Admitting: Emergency Medicine

## 2022-12-15 DIAGNOSIS — R197 Diarrhea, unspecified: Secondary | ICD-10-CM | POA: Diagnosis not present

## 2022-12-15 DIAGNOSIS — R112 Nausea with vomiting, unspecified: Secondary | ICD-10-CM | POA: Insufficient documentation

## 2022-12-15 DIAGNOSIS — Z5321 Procedure and treatment not carried out due to patient leaving prior to being seen by health care provider: Secondary | ICD-10-CM | POA: Insufficient documentation

## 2022-12-15 DIAGNOSIS — R1031 Right lower quadrant pain: Secondary | ICD-10-CM | POA: Diagnosis present

## 2022-12-15 LAB — CBC
HCT: 41 % (ref 36.0–46.0)
Hemoglobin: 13.9 g/dL (ref 12.0–15.0)
MCH: 29.3 pg (ref 26.0–34.0)
MCHC: 33.9 g/dL (ref 30.0–36.0)
MCV: 86.3 fL (ref 80.0–100.0)
Platelets: 316 10*3/uL (ref 150–400)
RBC: 4.75 MIL/uL (ref 3.87–5.11)
RDW: 12.8 % (ref 11.5–15.5)
WBC: 10.7 10*3/uL — ABNORMAL HIGH (ref 4.0–10.5)
nRBC: 0 % (ref 0.0–0.2)

## 2022-12-15 LAB — COMPREHENSIVE METABOLIC PANEL
ALT: 15 U/L (ref 0–44)
AST: 22 U/L (ref 15–41)
Albumin: 4.3 g/dL (ref 3.5–5.0)
Alkaline Phosphatase: 59 U/L (ref 38–126)
Anion gap: 9 (ref 5–15)
BUN: 16 mg/dL (ref 6–20)
CO2: 23 mmol/L (ref 22–32)
Calcium: 9.4 mg/dL (ref 8.9–10.3)
Chloride: 106 mmol/L (ref 98–111)
Creatinine, Ser: 0.88 mg/dL (ref 0.44–1.00)
GFR, Estimated: >60 mL/min (ref >60)
Glucose, Bld: 134 mg/dL — ABNORMAL HIGH (ref 70–99)
Potassium: 3.9 mmol/L (ref 3.5–5.1)
Sodium: 138 mmol/L (ref 135–145)
Total Bilirubin: 0.9 mg/dL (ref 0.3–1.2)
Total Protein: 6.8 g/dL (ref 6.5–8.1)

## 2022-12-15 LAB — URINALYSIS, ROUTINE W REFLEX MICROSCOPIC
Bilirubin Urine: NEGATIVE
Glucose, UA: NEGATIVE mg/dL
Hgb urine dipstick: NEGATIVE
Ketones, ur: NEGATIVE mg/dL
Leukocytes,Ua: NEGATIVE
Nitrite: NEGATIVE
Protein, ur: NEGATIVE mg/dL
Specific Gravity, Urine: 1.021 (ref 1.005–1.030)
pH: 5 (ref 5.0–8.0)

## 2022-12-15 LAB — LIPASE, BLOOD: Lipase: 36 U/L (ref 11–51)

## 2022-12-15 LAB — POC URINE PREG, ED: Preg Test, Ur: NEGATIVE

## 2022-12-15 NOTE — ED Triage Notes (Addendum)
Pt presents ambulatory to triage via POV with complaints of RLQ pain that started tonight, with associated N/V/D for the last 3 days. Hx of gallbladder sx, pancreatitis,  and SBO. Pt rates the pain 8/10- no radiation. A&Ox4 at this time. Denies CP or SOB.

## 2023-01-02 DIAGNOSIS — R079 Chest pain, unspecified: Secondary | ICD-10-CM | POA: Insufficient documentation

## 2023-01-02 DIAGNOSIS — Q390 Atresia of esophagus without fistula: Secondary | ICD-10-CM | POA: Insufficient documentation

## 2023-01-02 DIAGNOSIS — I479 Paroxysmal tachycardia, unspecified: Secondary | ICD-10-CM | POA: Insufficient documentation

## 2023-01-02 NOTE — Progress Notes (Unsigned)
Cardiology Office Note  Date:  01/04/2023   ID:  Burgess, Helen [birth year], MRN [id number]  PCP:  Cleon Dew, FNP   Chief Complaint  Patient presents with   6 month follow up    Patient c/o chest pain with eating and drinking, abdominal pain/swelling, pain in shoulder blades, shortness of breath, difficulty swallowing, frequent urination with the above symptoms are interfering with her being able to work. Medications reviewed by the patient verbally.      HPI:  [name] is a 47 yo woman with PMH of HTN Anxiety Paroxysmal tachycardia ASD repair, 1995 at [facility] (hole to big to close with catheter) Esophageal atresia with colonic interposition (age 72) Who presents for follow-up of her tachycardia  LOV 9/23 Reports having continued GI issues Increased dysphasia, both liquid and food, even saliva at time Food can get stuck in her throat for hours Causing chest pain Vomiting food back up for relief or massages her throat to push food down  Has tried numerous medications for relief, on PPI and H2 blocker Tried reglan  EGD:    -at [facility] An esophago-colonic anastomosis was found. The colonic interposition was without abnormality to explain the patient's dysphagia (outside of typical  dysmotility from such anatomy). Of note is that there were no food particles within this segment.  - Erythematous mucosa in the stomach. Biopsied.  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  - Normal examined duodenum.    ASD repair as a child.  Total chol 238, LDL 160 Now on lipitor 20  EKG personally reviewed by myself on todays visit NSR rate 84 no ST or T wave changes  given viscous lidocaine which greatly alleviated her symptoms and she was able to tolerate oral intake Tachycardia, heart rate 130 In "atrial flutter",  started on diltiazem ER 180 daily Having headaches daily, stopped drinking coffee, taking tylenol  Reports having long hx  of elevated heart rate Watch alarming for elevated heart rate  Works at [employer], gets neck discomfort   PMH:   has a past medical history of ASD (atrial septal defect), Bowel obstruction (HCC), CHF (congestive heart failure) (HCC), Dermatillomania, SBO, Migraines, Occipital neuralgia, and Panic attacks.  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     atorvastatin (LIPITOR) 20 MG tablet Take 20 mg by mouth daily.     Azelaic Acid 15 % gel Apply 1 Application topically 2 (two) times daily.     budesonide-formoterol (SYMBICORT) 160-4.5 MCG/ACT inhaler Inhale 2 puffs into the lungs as directed.     clindamycin (CLEOCIN T) 1 % external solution Apply topically.     clobetasol (TEMOVATE) 0.05 % external solution Apply 1 Application topically 2 (two) times daily.     diltiazem (CARDIZEM CD) 180 MG 24 hr capsule Take 1 capsule (180 mg total) by mouth daily. 30 capsule 6   famotidine (PEPCID) 20 MG tablet Take 20 mg by mouth 2 (two) times daily.     hydrOXYzine (ATARAX) 25 MG tablet Take 25 mg by mouth every 8 (eight) hours as needed.     ketoconazole (  NIZORAL) 2 % shampoo Apply 1 Application topically 3 (three) times a week.     omeprazole (PRILOSEC) 20 MG capsule Take 1 capsule by mouth daily.     Roflumilast, Antiseborrheic, (ZORYVE) 0.3 % FOAM      AJOVY 225 MG/1.5ML SOSY Take 1 mL by mouth every 30 (thirty) days. (Patient not taking: Reported on 04/26/2022)  3   buprenorphine-naloxone (SUBOXONE) 8-2 mg SUBL SL tablet Place 1.5 tablets under the tongue daily. (Patient not taking: Reported on 04/26/2022)     cyclobenzaprine (FLEXERIL) 10 MG tablet Take 1 tablet (10 mg total) by mouth 3 (three) times  daily as needed for muscle spasms. (Patient not taking: Reported on 01/04/2023) 20 tablet 0   diazepam (VALIUM) 5 MG tablet Take 5 mg by mouth at bedtime as needed for anxiety.  (Patient not taking: Reported on 04/26/2022)     dicyclomine (BENTYL) 10 MG capsule Take 1 capsule (10 mg total) by mouth 4 (four) times daily -  before meals and at bedtime. (Patient not taking: Reported on 01/04/2023) 60 capsule 1   escitalopram (LEXAPRO) 20 MG tablet Take 20 mg by mouth daily. (Patient not taking: Reported on 04/26/2022)     gabapentin (NEURONTIN) 300 MG capsule Take 300 mg by mouth 3 (three) times daily.  (Patient not taking: Reported on 04/26/2022)     lidocaine (XYLOCAINE) 2 % solution Use as directed 10 mLs in the mouth or throat every 4 (four) hours as needed (throat pain). Swallow as needed (Patient not taking: Reported on 04/26/2022) 100 mL 0   metoCLOPramide (REGLAN) 10 MG tablet Take 1 tablet (10 mg total) by mouth 3 (three) times daily with meals. (Patient not taking: Reported on 01/04/2023) 90 tablet 0   morphine (MS CONTIN) 30 MG 12 hr tablet Take 30 mg by mouth 3 (three) times daily. (Patient not taking: Reported on 04/26/2022)     naloxone [facility]) nasal spray 4 mg/0.1 mL Place 1 spray into the nose as needed. (Patient not taking: Reported on 04/26/2022)     oxyCODONE-acetaminophen (PERCOCET) 10-325 MG tablet Take 1 tablet by mouth every 8 (eight) hours as needed for pain.  (Patient not taking: Reported on 04/26/2022)  0   pantoprazole (PROTONIX) 40 MG tablet Take 1 tablet (40 mg total) by mouth daily. (Patient not taking: Reported on 01/04/2023) 30 tablet 0   QUEtiapine (SEROQUEL) 50 MG tablet Take 50 mg by mouth 2 (two) times daily as needed.  (Patient not taking: Reported on 04/26/2022)     triazolam (HALCION) 0.25 MG tablet SMARTSIG:2 Tablet(s) By Mouth (Patient not taking: Reported on 01/04/2023)     No current facility-administered medications for this visit.     Allergies:   Metoprolol,  Ibuprofen, Nsaids, and Ultram [tramadol]   Social History:  The patient  reports that she has never smoked. She has never used smokeless tobacco. She reports that she does not drink alcohol and does not use drugs.   Family History:   family history includes Heart disease in her father.    Review of Systems: Review of Systems  Constitutional: Negative.   HENT: Negative.    Respiratory: Negative.    Cardiovascular: Negative.   Gastrointestinal: Negative.   Musculoskeletal: Negative.   Neurological: Negative.   Psychiatric/Behavioral: Negative.    All other systems reviewed and are negative.    PHYSICAL EXAM: VS:  BP (!) 140/98 (BP Location: Left Arm, Patient Position: Sitting, Cuff Size: Normal)   Pulse 84   Ht 5\' 8"  (  1.727 m)   Wt 169 lb (76.7 kg)   SpO2 98%   BMI 25.70 kg/m  , BMI Body mass index is 25.7 kg/m. Constitutional:  oriented to person, place, and time. No distress.  HENT:  Head: Grossly normal Eyes:  no discharge. No scleral icterus.  Neck: No JVD, no carotid bruits  Cardiovascular: Regular rate and rhythm, no murmurs appreciated Pulmonary/Chest: Clear to auscultation bilaterally, no wheezes or rails Abdominal: Soft.  no distension.  no tenderness.  Musculoskeletal: Normal range of motion Neurological:  normal muscle tone. Coordination normal. No atrophy Skin: Skin warm and dry Psychiatric: normal affect, pleasant  Recent Labs: 04/09/2022: Magnesium 1.8; TSH 1.480 12/15/2022: ALT 15; BUN 16; Creatinine, Ser 0.88; Hemoglobin 13.9; Platelets 316; Potassium 3.9; Sodium 138    Lipid Panel Lab Results  Component Value Date   CHOL 122 03/29/2015   HDL 21 (L) 03/29/2015   LDLCALC 59 03/29/2015   TRIG 211 (H) 03/29/2015      Wt Readings from Last 3 Encounters:  01/04/23 169 lb (76.7 kg)  12/15/22 165 lb 12.6 oz (75.2 kg)  11/28/22 165 lb (74.8 kg)      ASSESSMENT AND PLAN:  Problem List Items Addressed This Visit       Cardiology Problems    Paroxysmal tachycardia (HCC)   Relevant Medications   atorvastatin (LIPITOR) 20 MG tablet     Other   Chest pain of uncertain etiology - Primary   Esophageal atresia  Paroxysmal tachycardia Exacerbated by  esophageal atresia Continue diltiazem ER at current dose 180 mg daily  Chest pain Episodic in the setting of GI issues Vomits the food back up or massages it down her throat to get it down She has follow-up with GI  Essential hypertension Blood pressure is high end of her range today, anxious, tearful,  no changes made to the medications.  Esophageal atresia Long history of disorder, food will stick  Followed by GI at [facility] On PPI, H2 Continues to have issues Recommend she be very selective in her foods   Total encounter time more than 30 minutes  Greater than 50% was spent in counseling and coordination of care with the patient    Signed, Dossie Arbour, M.D., Ph.D. [facility] [place], [place] [phone]

## 2023-01-04 ENCOUNTER — Ambulatory Visit: Payer: 59 | Attending: Cardiovascular Disease | Admitting: Cardiovascular Disease

## 2023-01-04 ENCOUNTER — Encounter: Payer: Self-pay | Admitting: Cardiovascular Disease

## 2023-01-04 VITALS — BP 140/90 | HR 84 | Ht 68.0 in | Wt 169.0 lb

## 2023-01-04 DIAGNOSIS — R079 Chest pain, unspecified: Secondary | ICD-10-CM | POA: Diagnosis not present

## 2023-01-04 DIAGNOSIS — Q390 Atresia of esophagus without fistula: Secondary | ICD-10-CM | POA: Diagnosis not present

## 2023-01-04 DIAGNOSIS — I479 Paroxysmal tachycardia, unspecified: Secondary | ICD-10-CM

## 2023-01-04 DIAGNOSIS — F419 Anxiety disorder, unspecified: Secondary | ICD-10-CM | POA: Diagnosis not present

## 2023-01-04 MED ORDER — DILTIAZEM HCL ER COATED BEADS 180 MG PO CP24: 180.0000 mg | ORAL_CAPSULE | Freq: Every day | ORAL | 3 refills | Status: AC

## 2023-01-04 NOTE — Patient Instructions (Signed)
Medication Instructions:  No changes  If you need a refill on your cardiac medications before your next appointment, please call your pharmacy.   Lab work: No new labs needed  Testing/Procedures: No new testing needed  Follow-Up: At [facility], you and your health needs are our priority.  As part of our continuing mission to provide you with exceptional heart care, we have created designated Provider Care Teams.  These Care Teams include your primary Cardiologist (physician) and Advanced Practice Providers (APPs -  Physician Assistants and Nurse Practitioners) who all work together to provide you with the care you need, when you need it.  You will need a follow up appointment in 12 months  Providers on your designated Care Team:   Christopher Berge, NP Ryan Dunn, PA-C Cadence Furth, PA-C  COVID-19 Vaccine Information can be found at: [email or web address] For questions related to vaccine distribution or appointments, please email [email or web address] or call [phone].   

## 2023-05-01 ENCOUNTER — Ambulatory Visit
Admission: EM | Admit: 2023-05-01 | Discharge: 2023-05-01 | Disposition: A | Discharge status: Home / Self Care | Payer: 59

## 2023-05-01 DIAGNOSIS — R519 Headache, unspecified: Secondary | ICD-10-CM

## 2023-05-01 DIAGNOSIS — I161 Hypertensive emergency: Secondary | ICD-10-CM

## 2023-05-01 NOTE — ED Triage Notes (Addendum)
Pt c/o right side Headache x3days  Pt has tried tylenol, naproxen, Goodies, Ice packs and they have not helped.  Pt states that she went to [facility] from Mon-Wednesday for 2nd case of Pancreatitis due to NSAID use and the headache started in the hospital.  Pt came off of Prednisone tapered dose pack and was told to stop on the 2nd day due to the pancreatitis. Pt states that she took the NSAIDS regardless to stop the headache but the headache did not stop.

## 2023-05-01 NOTE — ED Notes (Signed)
Patient is being discharged from the Urgent Care and sent to the Emergency Department via private vehicle . Per Eusebio Friendly, PA, patient is in need of higher level of care due to severe temporal headache. Patient is aware and verbalizes understanding of plan of care.  Vitals:   05/01/23 1405  BP: (!) 180/109  Pulse: 90  Temp: 98.6 F (37 C)  SpO2: 99%

## 2023-05-01 NOTE — ED Provider Notes (Signed)
47 year old female presents for severe 8-9 out of 10 right sided temporal headache with severe pain in right side of neck x 5 days.  Patient reports symptoms started when she was admitted for pancreatitis.  She was admitted on 9/9 and just gone to the hospital couple of days ago.  Patient has been taking corticosteroids, NSAIDs, Tylenol, using heat and ice.  She says taking high doses of these medications has not helped the headache and it is getting worse.  She is tearful.  Blood pressure is 180/109.  She says she does have a history of migraines but has never had 1 this bad this lasted this long.  She reports that she used to go to pain management for her migraines and was given narcotic pain medication in addition to multiple other medications to manage her migraines.  She is not reporting any numbness/tingling or weakness.  Explained to patient my concerns for her severely elevated blood pressure in the face of the severe and worsening headache x 5 days.  Advised that symptoms could be consistent with status migrainosus but she is pointing to a lot of pain of the temporal region and temporal arteritis cannot be ruled out, although I do not see a pulsating artery.  Concern that she may need to have CT scan performed, IV fluids and pain medication for headache control at emergency department.  Advised patient to go back to [facility] emergency department or [facility] for evaluation.  She declines EMS transport and states she will drive herself at this time.  Very quick assessment done.  Patient is tearful.  Holding the right side of her head.  PERRL.  No facial drooping or weakness.  Speaking normally.  Normal gait.   Shirlee Latch, PA-C 05/01/23 1424

## 2023-05-01 NOTE — Discharge Instructions (Signed)
